# Patient Record
Sex: Female | Born: 1935 | Race: White | Hispanic: No | Marital: Married | Smoking: Never smoker
Health system: Southern US, Community
[De-identification: ages and names within clinical notes are randomized; demographics above are authoritative.]

## PROBLEM LIST (undated history)

## (undated) DIAGNOSIS — E7800 Pure hypercholesterolemia, unspecified: Secondary | ICD-10-CM

## (undated) DIAGNOSIS — I639 Cerebral infarction, unspecified: Secondary | ICD-10-CM

## (undated) DIAGNOSIS — I2699 Other pulmonary embolism without acute cor pulmonale: Secondary | ICD-10-CM

## (undated) DIAGNOSIS — T8859XA Other complications of anesthesia, initial encounter: Secondary | ICD-10-CM

## (undated) DIAGNOSIS — I219 Acute myocardial infarction, unspecified: Secondary | ICD-10-CM

## (undated) DIAGNOSIS — S82209A Unspecified fracture of shaft of unspecified tibia, initial encounter for closed fracture: Secondary | ICD-10-CM

## (undated) DIAGNOSIS — M25559 Pain in unspecified hip: Secondary | ICD-10-CM

## (undated) DIAGNOSIS — R112 Nausea with vomiting, unspecified: Secondary | ICD-10-CM

## (undated) DIAGNOSIS — K449 Diaphragmatic hernia without obstruction or gangrene: Secondary | ICD-10-CM

## (undated) DIAGNOSIS — I2510 Atherosclerotic heart disease of native coronary artery without angina pectoris: Secondary | ICD-10-CM

## (undated) DIAGNOSIS — T4145XA Adverse effect of unspecified anesthetic, initial encounter: Secondary | ICD-10-CM

## (undated) DIAGNOSIS — Z9889 Other specified postprocedural states: Secondary | ICD-10-CM

## (undated) HISTORY — PX: CORONARY STENT PLACEMENT: SHX1402

## (undated) HISTORY — PX: FRACTURE SURGERY: SHX138

## (undated) HISTORY — PX: LEG SURGERY: SHX1003

## (undated) HISTORY — PX: ABDOMINAL HYSTERECTOMY: SHX81

---

## 2012-10-23 ENCOUNTER — Encounter (HOSPITAL_COMMUNITY): Payer: Self-pay

## 2012-10-23 ENCOUNTER — Emergency Department (HOSPITAL_COMMUNITY)
Admission: EM | Admit: 2012-10-23 | Discharge: 2012-10-23 | Disposition: A | Discharge status: Home / Self Care | Payer: Medicare Other | Attending: Emergency Medicine | Admitting: Emergency Medicine

## 2012-10-23 ENCOUNTER — Emergency Department (HOSPITAL_COMMUNITY): Payer: Medicare Other

## 2012-10-23 DIAGNOSIS — E7800 Pure hypercholesterolemia, unspecified: Secondary | ICD-10-CM | POA: Insufficient documentation

## 2012-10-23 DIAGNOSIS — Z8719 Personal history of other diseases of the digestive system: Secondary | ICD-10-CM | POA: Insufficient documentation

## 2012-10-23 DIAGNOSIS — Z79899 Other long term (current) drug therapy: Secondary | ICD-10-CM | POA: Insufficient documentation

## 2012-10-23 DIAGNOSIS — Z86711 Personal history of pulmonary embolism: Secondary | ICD-10-CM | POA: Insufficient documentation

## 2012-10-23 DIAGNOSIS — Z8739 Personal history of other diseases of the musculoskeletal system and connective tissue: Secondary | ICD-10-CM | POA: Insufficient documentation

## 2012-10-23 DIAGNOSIS — R109 Unspecified abdominal pain: Secondary | ICD-10-CM

## 2012-10-23 DIAGNOSIS — R112 Nausea with vomiting, unspecified: Secondary | ICD-10-CM | POA: Insufficient documentation

## 2012-10-23 DIAGNOSIS — Z7982 Long term (current) use of aspirin: Secondary | ICD-10-CM | POA: Insufficient documentation

## 2012-10-23 DIAGNOSIS — Z87828 Personal history of other (healed) physical injury and trauma: Secondary | ICD-10-CM | POA: Insufficient documentation

## 2012-10-23 DIAGNOSIS — K5900 Constipation, unspecified: Secondary | ICD-10-CM | POA: Insufficient documentation

## 2012-10-23 DIAGNOSIS — R1013 Epigastric pain: Secondary | ICD-10-CM | POA: Insufficient documentation

## 2012-10-23 HISTORY — DX: Pure hypercholesterolemia, unspecified: E78.00

## 2012-10-23 HISTORY — DX: Other pulmonary embolism without acute cor pulmonale: I26.99

## 2012-10-23 HISTORY — DX: Pain in unspecified hip: M25.559

## 2012-10-23 HISTORY — DX: Unspecified fracture of shaft of unspecified tibia, initial encounter for closed fracture: S82.209A

## 2012-10-23 HISTORY — DX: Diaphragmatic hernia without obstruction or gangrene: K44.9

## 2012-10-23 LAB — URINALYSIS, ROUTINE W REFLEX MICROSCOPIC
Bilirubin Urine: NEGATIVE
Glucose, UA: NEGATIVE mg/dL
Ketones, ur: NEGATIVE mg/dL
Leukocytes, UA: NEGATIVE
Nitrite: NEGATIVE
Specific Gravity, Urine: 1.015 (ref 1.005–1.030)
pH: 7 (ref 5.0–8.0)

## 2012-10-23 LAB — CBC WITH DIFFERENTIAL/PLATELET
Eosinophils Absolute: 0 10*3/uL (ref 0.0–0.7)
Eosinophils Relative: 0 % (ref 0–5)
HCT: 42.2 % (ref 36.0–46.0)
Hemoglobin: 14.2 g/dL (ref 12.0–15.0)
Lymphocytes Relative: 12 % (ref 12–46)
Lymphs Abs: 1.7 10*3/uL (ref 0.7–4.0)
MCH: 29.6 pg (ref 26.0–34.0)
MCV: 88.1 fL (ref 78.0–100.0)
Monocytes Absolute: 1.3 10*3/uL — ABNORMAL HIGH (ref 0.1–1.0)
Monocytes Relative: 9 % (ref 3–12)
RBC: 4.79 MIL/uL (ref 3.87–5.11)

## 2012-10-23 LAB — BASIC METABOLIC PANEL
BUN: 17 mg/dL (ref 6–23)
CO2: 29 mEq/L (ref 19–32)
Calcium: 10.4 mg/dL (ref 8.4–10.5)
GFR calc non Af Amer: 80 mL/min — ABNORMAL LOW (ref >90)
Glucose, Bld: 120 mg/dL — ABNORMAL HIGH (ref 70–99)

## 2012-10-23 MED ORDER — GI COCKTAIL ~~LOC~~
30.0000 mL | Freq: Once | ORAL | Status: AC
  Administered 2012-10-23: 30 mL via ORAL
  Filled 2012-10-23: qty 30

## 2012-10-23 MED ORDER — TRAMADOL HCL 50 MG PO TABS
50.0000 mg | ORAL_TABLET | Freq: Once | ORAL | Status: AC
  Administered 2012-10-23: 50 mg via ORAL
  Filled 2012-10-23: qty 1

## 2012-10-23 MED ORDER — ONDANSETRON HCL 4 MG/2ML IJ SOLN
4.0000 mg | Freq: Once | INTRAMUSCULAR | Status: AC
  Administered 2012-10-23: 4 mg via INTRAVENOUS
  Filled 2012-10-23: qty 2

## 2012-10-23 MED ORDER — SODIUM CHLORIDE 0.9 % IV SOLN
1000.0000 mL | Freq: Once | INTRAVENOUS | Status: AC
  Administered 2012-10-23: 1000 mL via INTRAVENOUS

## 2012-10-23 MED ORDER — HYDROMORPHONE HCL PF 1 MG/ML IJ SOLN
0.5000 mg | Freq: Once | INTRAMUSCULAR | Status: DC
  Filled 2012-10-23: qty 1

## 2012-10-23 MED ORDER — SODIUM CHLORIDE 0.9 % IV SOLN
Freq: Once | INTRAVENOUS | Status: AC
  Administered 2012-10-23: 1000 mL via INTRAVENOUS

## 2012-10-23 MED ORDER — OMEPRAZOLE 20 MG PO CPDR: 20.0000 mg | DELAYED_RELEASE_CAPSULE | Freq: Every day | ORAL | Status: DC

## 2012-10-23 MED ORDER — SUCRALFATE 1 GM/10ML PO SUSP: 1.0000 g | ORAL | Status: DC | PRN

## 2012-10-23 NOTE — ED Notes (Signed)
Pt reports woke up in the middle of the night with nonradiating  substernal chest pain, n/v.  Reports has vomited x 2 today.  Describes pain as a "squeezing pain."  Says is SOB at times.

## 2012-10-23 NOTE — ED Provider Notes (Signed)
History   This chart was scribed for Alexandria Munch, MD by [name]. The patient was seen in room APA02/[facility]02 and the patient's care was started at 3:19PM.    CSN: [id number]  Arrival date & time 10/23/12  1426   First MD Initiated Contact with Patient 10/23/12 1519      Chief Complaint  Patient presents with  . Chest Pain    The history is provided by the patient. No language interpreter was used.    Alexandria Castillo is a 78 y.o. female , with a hx of hiatal hernia, who presents to the Emergency Department complaining of intermittent, progressively worsening, epigastric pain, onset yesterday (10/22/12 at 8:00PM).  Associated symptoms include nausea and vomiting ( X 1 today, dark in color). The pt reports she ate peanut butter crackers yesterday evening, where shortly thereafter, she began to experience a painful sensation located at her epigastrum. In addition, the pt informs the last time she had a heart exam performed was last year. The pt has taken tramadol which provides moderate relief of the epigastric pain.  The pt denies any recent illness, difficulty breathing, nausea, LOC, and weakness associated with the pain.   The pt does not smoke or drink alcohol.   PCP is Dr. Maryellen Pile.    Past Medical History  Diagnosis Date  . Hiatal hernia   . Hypercholesterolemia   . Fractured tibia   . Hip pain   . PE (pulmonary embolism)     Past Surgical History  Procedure Date  . Abdominal hysterectomy   . Leg surgery     No family history on file.  History  Substance Use Topics  . Smoking status: Never Smoker   . Smokeless tobacco: Not on file  . Alcohol Use: No    OB History    Grav Para Term Preterm Abortions TAB SAB Ect Mult Living                  Review of Systems  Constitutional:       Per HPI, otherwise negative  HENT:       Per HPI, otherwise negative  Eyes: Negative.   Respiratory:       Per HPI, otherwise negative  Cardiovascular:       Per HPI, otherwise  negative  Gastrointestinal: Positive for nausea, vomiting and constipation. Negative for blood in stool.  Genitourinary: Negative.   Musculoskeletal:       Per HPI, otherwise negative  Skin: Negative.   Neurological: Negative for syncope.    Allergies  Review of patient's allergies indicates no known allergies.  Home Medications   Current Outpatient Rx  Name  Route  Sig  Dispense  Refill  . ASPIRIN EC 325 MG PO TBEC   Oral   Take 325 mg by mouth daily.         Marland Kitchen CALCIUM 600 + D PO   Oral   Take 1 tablet by mouth daily.         . NYSTATIN 100000 UNIT/GM EX CREA   Topical   Apply 1 application topically 2 (two) times daily.         Marland Kitchen FISH OIL 1000 MG PO CAPS   Oral   Take 2 capsules by mouth daily.         . TRAMADOL HCL 50 MG PO TABS   Oral   Take 50 mg by mouth every 6 (six) hours as needed. Pain.         Marland Kitchen  VITAMIN B-12 500 MCG PO TABS   Oral   Take 500 mcg by mouth daily.         Marland Kitchen VITAMIN D (ERGOCALCIFEROL) 50000 UNITS PO CAPS   Oral   Take 50,000 Units by mouth every 7 (seven) days.           BP 168/75  Pulse 73  Temp 98.1 F (36.7 C) (Oral)  Resp 21  SpO2 96%  Physical Exam  Nursing note and vitals reviewed. Constitutional: She is oriented to person, place, and time. She appears well-developed and well-nourished. No distress.  HENT:  Head: Normocephalic and atraumatic.  Eyes: Conjunctivae normal and EOM are normal.  Cardiovascular: Normal rate and regular rhythm.   Pulmonary/Chest: Effort normal and breath sounds normal. No stridor. No respiratory distress. She has no wheezes. She has no rales. She exhibits no tenderness.  Abdominal: She exhibits no distension. There is tenderness. There is no rebound and no guarding.       Epigastric tenderness detected upon palpitation.   Musculoskeletal: She exhibits no edema.  Neurological: She is alert and oriented to person, place, and time. No cranial nerve deficit.  Skin: Skin is warm and dry.   Psychiatric: She has a normal mood and affect.    ED Course  Procedures (including critical care time)  DIAGNOSTIC STUDIES: Oxygen Saturation is 96% on room air, adequate by my interpretation.    COORDINATION OF CARE:    3:37 PM- Treatment plan concerning pain management, nausea discussed with patient. Pt agrees with treatment.   5:23 PM- Recheck. Pt reports feeling better. Treatment plan concerning follow up with GI discussed with patient and pt's family members. Pt and pt's family agrees with treatment.      Results for orders placed during the hospital encounter of 10/23/12  CBC WITH DIFFERENTIAL      Component Value Range   WBC 14.4 (*) 4.0 - 10.5 K/uL   RBC 4.79  3.87 - 5.11 MIL/uL   Hemoglobin 14.2  12.0 - 15.0 g/dL   HCT 16.1  09.6 - 04.5 %   MCV 88.1  78.0 - 100.0 fL   MCH 29.6  26.0 - 34.0 pg   MCHC 33.6  30.0 - 36.0 g/dL   RDW 40.9 (*) 81.1 - 91.4 %   Platelets 195  150 - 400 K/uL   Neutrophils Relative 79 (*) 43 - 77 %   Neutro Abs 11.4 (*) 1.7 - 7.7 K/uL   Lymphocytes Relative 12  12 - 46 %   Lymphs Abs 1.7  0.7 - 4.0 K/uL   Monocytes Relative 9  3 - 12 %   Monocytes Absolute 1.3 (*) 0.1 - 1.0 K/uL   Eosinophils Relative 0  0 - 5 %   Eosinophils Absolute 0.0  0.0 - 0.7 K/uL   Basophils Relative 0  0 - 1 %   Basophils Absolute 0.0  0.0 - 0.1 K/uL  BASIC METABOLIC PANEL      Component Value Range   Sodium 138  135 - 145 mEq/L   Potassium 3.9  3.5 - 5.1 mEq/L   Chloride 101  96 - 112 mEq/L   CO2 29  19 - 32 mEq/L   Glucose, Bld 120 (*) 70 - 99 mg/dL   BUN 17  6 - 23 mg/dL   Creatinine, Ser 7.82  0.50 - 1.10 mg/dL   Calcium 95.6  8.4 - 21.3 mg/dL   GFR calc non Af Amer 80 (*) >90 mL/min  GFR calc Af Amer >90  >90 mL/min  TROPONIN I      Component Value Range   Troponin I <0.30  <0.30 ng/mL  LIPASE, BLOOD      Component Value Range   Lipase 14  11 - 59 U/L  URINALYSIS, ROUTINE W REFLEX MICROSCOPIC      Component Value Range   Color, Urine YELLOW   YELLOW   APPearance CLEAR  CLEAR   Specific Gravity, Urine 1.015  1.005 - 1.030   pH 7.0  5.0 - 8.0   Glucose, UA NEGATIVE  NEGATIVE mg/dL   Hgb urine dipstick NEGATIVE  NEGATIVE   Bilirubin Urine NEGATIVE  NEGATIVE   Ketones, ur NEGATIVE  NEGATIVE mg/dL   Protein, ur NEGATIVE  NEGATIVE mg/dL   Urobilinogen, UA 0.2  0.0 - 1.0 mg/dL   Nitrite NEGATIVE  NEGATIVE   Leukocytes, UA NEGATIVE  NEGATIVE   Dg Chest Portable 1 View  10/23/2012  *RADIOLOGY REPORT*  Clinical Data: Hiatal hernia.  Hysterectomy.  Chest pain.  PORTABLE CHEST - 1 VIEW  Comparison: None.  Findings: Upper normal heart size noted without edema.  Lungs appear clear.  Mild dextroconvex thoracic scoliosis is observed. No pleural effusion identified.  Right paratracheal density is likely vascular and less likely to be due to nodal enlargement.  IMPRESSION:  1.  Heart size is in the upper normal range.  No edema. 2.  Right upper paratracheal density, favor vascular over adenopathy. 3.  Dextroconvex thoracic scoliosis.   Original Report Authenticated By: Gaylyn Rong, M.D.      Cardiac: 71sr, normal  O2- 99%ra, normal   Date: 10/23/2012  Rate: 76  Rhythm: normal sinus rhythm and sinus arrhythmia  QRS Axis: normal  Intervals: normal  ST/T Wave abnormalities: nonspecific T wave changes  Conduction Disutrbances:right bundle branch block  Narrative Interpretation:   Old EKG Reviewed: none available  ABNORMAL   No diagnosis found.  UPDATE: - the patient feels better following GI cocktail.   Update: - patient and family informed of results.  She continues to feel better.    MDM  I personally performed the services described in this documentation, which was scribed in my presence. The recorded information has been reviewed and considered.  This pleasant elderly female presents with one day of epigastric pain.  On exam she is in no distress, with unremarkable vital signs.  The patient has tenderness to palpation  about the epigastrium.  Given the patient's relief with a GI cocktail, the absence of positive cardiac markers, which would likely reflects any ongoing cardiac ischemia, given the passage of time since the onset of symptoms, as well as her generally benign presentation, this presentation is unlikely do to cardiac ischemia, or infectious etiology.  There is some suspicion of gastro-, esophageal irritation, plus minus hiatal hernia effects. I discussed all findings, thoughts with the patient and her family, and she was discharged in stable condition with a new PPI, as well as topical medication, with GI and PMD followup  Alexandria Munch, MD 10/23/12 1746

## 2012-11-06 ENCOUNTER — Ambulatory Visit (INDEPENDENT_AMBULATORY_CARE_PROVIDER_SITE_OTHER): Payer: Medicare Other | Admitting: Internal Medicine

## 2012-11-06 ENCOUNTER — Encounter (INDEPENDENT_AMBULATORY_CARE_PROVIDER_SITE_OTHER): Payer: Self-pay | Admitting: *Deleted

## 2012-11-06 ENCOUNTER — Other Ambulatory Visit (INDEPENDENT_AMBULATORY_CARE_PROVIDER_SITE_OTHER): Payer: Self-pay | Admitting: *Deleted

## 2012-11-06 ENCOUNTER — Encounter (INDEPENDENT_AMBULATORY_CARE_PROVIDER_SITE_OTHER): Payer: Self-pay | Admitting: Internal Medicine

## 2012-11-06 VITALS — BP 106/80 | Temp 98.3°F | Ht 63.0 in | Wt 198.0 lb

## 2012-11-06 DIAGNOSIS — R1310 Dysphagia, unspecified: Secondary | ICD-10-CM | POA: Insufficient documentation

## 2012-11-06 DIAGNOSIS — K920 Hematemesis: Secondary | ICD-10-CM | POA: Insufficient documentation

## 2012-11-06 NOTE — Patient Instructions (Addendum)
EGD/ED 

## 2012-11-06 NOTE — Progress Notes (Addendum)
Subjective:     Patient ID: Alexandria Castillo, female   DOB: Apr 04, 1929, 78 y.o.   MRN: [id number]  HPI Referred to our office by the ED on 10/23/2012.  She has epigastric pain. She also vomited coffee ground vomitus. Appetite is good. No weight. No epigastric pain now.  Her husband says she vomited black. She thinks she vomited about 1/2 quart.. There was no further vomiting. She received a GI cocktail and this relieved her pain. Cardiac was ruled out. Her BMs are tan colored. She has been taking ASA 325mg  daily. No other NSAIDS. Started on Carafate and Omeprazole Occasionally dysphagia to pills. Meats are slow to go down.  CBC    Component Value Date/Time   WBC 14.4* 10/23/2012 1456   RBC 4.79 10/23/2012 1456   HGB 14.2 10/23/2012 1456   HCT 42.2 10/23/2012 1456   PLT 195 10/23/2012 1456   MCV 88.1 10/23/2012 1456   MCH 29.6 10/23/2012 1456   MCHC 33.6 10/23/2012 1456   RDW 16.0* 10/23/2012 1456   LYMPHSABS 1.7 10/23/2012 1456   MONOABS 1.3* 10/23/2012 1456   EOSABS 0.0 10/23/2012 1456   BASOSABS 0.0 10/23/2012 1456      Review of Systems see hpi     Past Surgical History  Procedure Date  . Abdominal hysterectomy   . Leg surgery    Allergies  Allergen Reactions  . Baycol (Cerivastatin)   . Ciprofloxacin   . Decadron (Dexamethasone)   . Imipramine   . Oxycodone   . Premarin (Conjugated Estrogens)   . Synthroid (Levothyroxine)         Objective:   Physical Exam Filed Vitals:   11/06/12 1058  BP: 106/80  Temp: 98.3 F (36.8 C)  Height: 5\' 3"  (1.6 m)  Weight: 198 lb (89.812 kg)  Patient examined from wheelchair.  Weight is stated. Unable to stand Alert and oriented. Skin warm and dry. Oral mucosa is moist.   . Sclera anicteric, conjunctivae is pink. Thyroid not enlarged. No cervical lymphadenopathy. Lungs clear. Heart regular rate and rhythm.  Abdomen is soft. Bowel sounds are positive. No hepatomegaly. No abdominal masses felt. No tenderness.  Stool brown and guaiac  negative.     Assessment:   hematemesis. PUD/Gastric carcinoma, AVM needs to be ruled out. Dysphagia: stricture needs to be ruled out.     Plan:    EGD/ED with Dr. Karilyn Cota.

## 2012-11-13 ENCOUNTER — Encounter (HOSPITAL_COMMUNITY): Payer: Self-pay | Admitting: Pharmacy Technician

## 2012-11-20 ENCOUNTER — Encounter (INDEPENDENT_AMBULATORY_CARE_PROVIDER_SITE_OTHER): Payer: Self-pay

## 2012-12-03 MED ORDER — SODIUM CHLORIDE 0.45 % IV SOLN
INTRAVENOUS | Status: DC
  Administered 2012-12-04: 12:00:00 via INTRAVENOUS

## 2012-12-04 ENCOUNTER — Encounter (HOSPITAL_COMMUNITY): Payer: Self-pay

## 2012-12-04 ENCOUNTER — Ambulatory Visit (HOSPITAL_COMMUNITY)
Admission: RE | Admit: 2012-12-04 | Discharge: 2012-12-04 | Disposition: A | Discharge status: Home / Self Care | Payer: Medicare Other | Source: Ambulatory Visit | Attending: Internal Medicine | Admitting: Internal Medicine

## 2012-12-04 ENCOUNTER — Encounter (HOSPITAL_COMMUNITY)
Admission: RE | Disposition: A | Discharge status: Home / Self Care | Payer: Self-pay | Source: Ambulatory Visit | Attending: Internal Medicine

## 2012-12-04 DIAGNOSIS — R1310 Dysphagia, unspecified: Secondary | ICD-10-CM

## 2012-12-04 DIAGNOSIS — Z8719 Personal history of other diseases of the digestive system: Secondary | ICD-10-CM

## 2012-12-04 DIAGNOSIS — K222 Esophageal obstruction: Secondary | ICD-10-CM

## 2012-12-04 DIAGNOSIS — K449 Diaphragmatic hernia without obstruction or gangrene: Secondary | ICD-10-CM | POA: Insufficient documentation

## 2012-12-04 DIAGNOSIS — K2990 Gastroduodenitis, unspecified, without bleeding: Secondary | ICD-10-CM

## 2012-12-04 DIAGNOSIS — K208 Other esophagitis: Secondary | ICD-10-CM

## 2012-12-04 DIAGNOSIS — K209 Esophagitis, unspecified without bleeding: Secondary | ICD-10-CM | POA: Insufficient documentation

## 2012-12-04 DIAGNOSIS — K2970 Gastritis, unspecified, without bleeding: Secondary | ICD-10-CM

## 2012-12-04 DIAGNOSIS — K2960 Other gastritis without bleeding: Secondary | ICD-10-CM | POA: Insufficient documentation

## 2012-12-04 HISTORY — PX: ESOPHAGOGASTRODUODENOSCOPY (EGD) WITH ESOPHAGEAL DILATION: SHX5812

## 2012-12-04 HISTORY — DX: Adverse effect of unspecified anesthetic, initial encounter: T41.45XA

## 2012-12-04 HISTORY — DX: Other complications of anesthesia, initial encounter: T88.59XA

## 2012-12-04 HISTORY — DX: Other specified postprocedural states: R11.2

## 2012-12-04 HISTORY — DX: Nausea with vomiting, unspecified: Z98.890

## 2012-12-04 SURGERY — ESOPHAGOGASTRODUODENOSCOPY (EGD) WITH ESOPHAGEAL DILATION
Anesthesia: Moderate Sedation

## 2012-12-04 MED ORDER — BUTAMBEN-TETRACAINE-BENZOCAINE 2-2-14 % EX AERO
INHALATION_SPRAY | CUTANEOUS | Status: DC | PRN
  Administered 2012-12-04: 2 via TOPICAL

## 2012-12-04 MED ORDER — MEPERIDINE HCL 25 MG/ML IJ SOLN
INTRAMUSCULAR | Status: DC | PRN
  Administered 2012-12-04 (×2): 25 mg via INTRAVENOUS

## 2012-12-04 MED ORDER — MIDAZOLAM HCL 5 MG/5ML IJ SOLN
INTRAMUSCULAR | Status: DC | PRN
  Administered 2012-12-04 (×3): 1 mg via INTRAVENOUS
  Administered 2012-12-04: 2 mg via INTRAVENOUS

## 2012-12-04 MED ORDER — MEPERIDINE HCL 50 MG/ML IJ SOLN
INTRAMUSCULAR | Status: AC
  Filled 2012-12-04: qty 1

## 2012-12-04 MED ORDER — MIDAZOLAM HCL 5 MG/5ML IJ SOLN
INTRAMUSCULAR | Status: AC
  Filled 2012-12-04: qty 10

## 2012-12-04 NOTE — Op Note (Signed)
EGD PROCEDURE REPORT  PATIENT:  Alexandria Castillo  MR#:  [id number] Birthdate:  1929-06-26, 78 y.o., female Endoscopist:  Dr. Malissa Hippo, MD Referred By:  Dr. Kathlee Nations, MD Procedure Date: 12/04/2012  Procedure:   EGD with ED.  Indications:  Patient is 78 year old Caucasian female who is chronically on aspirin 325 mg daily who presented with coffee on emesis but 6 weeks ago. She's been treated with PPI and Carafate and doing fine. She also complains of intermittent solid food dysphagia. She is undergoing diagnostic/therapeutic EGD.            Informed Consent:  The risks, benefits, alternatives & imponderables which include, but are not limited to, bleeding, infection, perforation, drug reaction and potential missed lesion have been reviewed.  The potential for biopsy, lesion removal, esophageal dilation, etc. have also been discussed.  Questions have been answered.  All parties agreeable.  Please see history & physical in medical record for more information.  Medications:  Demerol 50 mg IV Versed 5 mg IV Cetacaine spray topically for oropharyngeal anesthesia  Description of procedure:  The endoscope was introduced through the mouth and advanced to the second portion of the duodenum without difficulty or limitations. The mucosal surfaces were surveyed very carefully during advancement of the scope and upon withdrawal.  Findings:  Esophagus:  Mucosa of the esophagus was normal. Focal edema and erythema noted at GE junction. GEJ:  34 cm Hiatus:  36 cm Stomach:  Stomach was empty and distended very well with insufflation. Folds in the proximal stomach were normal. Examination mucosa at body was normal. There were two antral scars along with patchy edema and erythema. Pyloric channel was patent. Angularis fundus and cardia were examined by retroflexing the scope and were normal. Duodenum:  Normal bulbar and post bulbar mucosa. Large diverticulum noted long the medial wall of second part of  duodenum.  Therapeutic/Diagnostic Maneuvers Performed:   Esophagus dilated by passing 54 French Maloney dilator resulting in mucosal disruption just below UES along with small submucosal action of blood.  Complications:  None  Impression: Mild changes of reflux esophagitis limited to GE junction  Small sliding hiatal hernia. Antral gastritis with two scars implying healed ulcers. Esophageal dilation performed by passing 54 French Maloney dilator resulting in small mucosal disruption below UES indicative of esophageal web.  Recommendations:  Standard instructions given. Patient will continue omeprazole at 20 mg by mouth every morning. Resume aspirin in 2 days. Will check H. pylori serology today.  REHMAN,NAJEEB U  12/04/2012  1:24 PM  CC: Dr. Kathlee Nations, MD & Dr. Bonnetta Barry ref. provider found

## 2012-12-04 NOTE — H&P (Addendum)
Alexandria Castillo is an 78 y.o. female.   Chief Complaint: Patient is in for EGD. HPI: Patient is 78 year old Caucasian female who experienced coffee-ground emesis about 6 weeks ago when she was seen in emergency room and felt to be stable. She was begun on PPI and sucralfate. She has not had any more episodes. She denies chronic heartburn epigastric pain or melena. She does take aspirin 325 mg by mouth daily. He has good appetite and has not lost any weight. He also complains of intermittent solid food dysphagia.  Past Medical History  Diagnosis Date  . Hiatal hernia   . Hypercholesterolemia   . Fractured tibia   . Hip pain   . PE (pulmonary embolism)   . Complication of anesthesia   . PONV (postoperative nausea and vomiting)     Past Surgical History  Procedure Date  . Abdominal hysterectomy   . Leg surgery     History reviewed. No pertinent family history. Social History:  reports that she has never smoked. She does not have any smokeless tobacco history on file. She reports that she does not drink alcohol or use illicit drugs.  Allergies:  Allergies  Allergen Reactions  . Baycol (Cerivastatin)   . Ciprofloxacin   . Decadron (Dexamethasone)   . Imipramine   . Oxycodone   . Premarin (Conjugated Estrogens)   . Synthroid (Levothyroxine)     Medications Prior to Admission  Medication Sig Dispense Refill  . aspirin EC 325 MG tablet Take 325 mg by mouth daily.      . Calcium Carbonate-Vitamin D (CALCIUM 600 + D PO) Take 1 tablet by mouth daily.      Marland Kitchen nystatin cream (MYCOSTATIN) Apply 1 application topically 2 (two) times daily.      . Omega-3 Fatty Acids (FISH OIL) 1000 MG CAPS Take 2 capsules by mouth daily.      Marland Kitchen omeprazole (PRILOSEC) 20 MG capsule Take 1 capsule (20 mg total) by mouth daily.  30 capsule  0  . sucralfate (CARAFATE) 1 GM/10ML suspension Take 10 mLs (1 g total) by mouth every 4 (four) hours as needed (abdominal pain).  420 mL  0  . traMADol (ULTRAM) 50 MG  tablet Take 50 mg by mouth every 6 (six) hours as needed. Pain.      . vitamin B-12 (CYANOCOBALAMIN) 500 MCG tablet Take 500 mcg by mouth daily.      . Vitamin D, Ergocalciferol, (DRISDOL) 50000 UNITS CAPS Take 50,000 Units by mouth every 7 (seven) days.        No results found for this or any previous visit (from the past 48 hour(s)). No results found.  ROS  Blood pressure 169/91, pulse 85, temperature 97.6 F (36.4 C), temperature source Oral, resp. rate 12, height 5\' 3"  (1.6 m), weight 198 lb (89.812 kg). Physical Exam  Constitutional: She appears well-developed and well-nourished.  HENT:  Mouth/Throat: Oropharynx is clear and moist.  Eyes: Conjunctivae normal are normal. No scleral icterus.  Neck: No thyromegaly present.  Cardiovascular: Regular rhythm and normal heart sounds.   No murmur heard. Respiratory: Effort normal.  GI: Soft. She exhibits no distension and no mass.  Musculoskeletal: She exhibits no edema.  Lymphadenopathy:    She has no cervical adenopathy.  Neurological: She is alert.  Skin: Skin is warm and dry.     Assessment/Plan History of coffee-ground emesis in patient who is in aspirin. She also has intermittent solid food dysphagia EGD and possible ED.  [name] U  12/04/2012, 12:58 PM

## 2012-12-05 LAB — H. PYLORI ANTIBODY, IGG: H Pylori IgG: 0.67 {ISR}

## 2012-12-06 ENCOUNTER — Encounter (HOSPITAL_COMMUNITY): Payer: Self-pay | Admitting: Internal Medicine

## 2014-07-11 ENCOUNTER — Emergency Department (HOSPITAL_COMMUNITY): Payer: Medicare PPO

## 2014-07-11 ENCOUNTER — Inpatient Hospital Stay (HOSPITAL_COMMUNITY)
Admission: EM | Admit: 2014-07-11 | Discharge: 2014-07-14 | DRG: 981 | Disposition: A | Discharge status: Home / Self Care | Payer: Medicare PPO | Attending: Cardiology | Admitting: Cardiology

## 2014-07-11 ENCOUNTER — Observation Stay (HOSPITAL_COMMUNITY): Payer: Medicare PPO

## 2014-07-11 ENCOUNTER — Encounter (HOSPITAL_COMMUNITY): Payer: Self-pay | Admitting: Emergency Medicine

## 2014-07-11 DIAGNOSIS — Z993 Dependence on wheelchair: Secondary | ICD-10-CM | POA: Diagnosis not present

## 2014-07-11 DIAGNOSIS — G459 Transient cerebral ischemic attack, unspecified: Secondary | ICD-10-CM | POA: Diagnosis present

## 2014-07-11 DIAGNOSIS — Z9861 Coronary angioplasty status: Secondary | ICD-10-CM

## 2014-07-11 DIAGNOSIS — Z888 Allergy status to other drugs, medicaments and biological substances status: Secondary | ICD-10-CM | POA: Diagnosis not present

## 2014-07-11 DIAGNOSIS — Z7982 Long term (current) use of aspirin: Secondary | ICD-10-CM

## 2014-07-11 DIAGNOSIS — I472 Ventricular tachycardia, unspecified: Secondary | ICD-10-CM | POA: Diagnosis not present

## 2014-07-11 DIAGNOSIS — Z86711 Personal history of pulmonary embolism: Secondary | ICD-10-CM | POA: Diagnosis not present

## 2014-07-11 DIAGNOSIS — Z885 Allergy status to narcotic agent status: Secondary | ICD-10-CM

## 2014-07-11 DIAGNOSIS — I639 Cerebral infarction, unspecified: Secondary | ICD-10-CM

## 2014-07-11 DIAGNOSIS — E785 Hyperlipidemia, unspecified: Secondary | ICD-10-CM | POA: Diagnosis present

## 2014-07-11 DIAGNOSIS — I2119 ST elevation (STEMI) myocardial infarction involving other coronary artery of inferior wall: Secondary | ICD-10-CM | POA: Diagnosis not present

## 2014-07-11 DIAGNOSIS — I4729 Other ventricular tachycardia: Secondary | ICD-10-CM | POA: Diagnosis not present

## 2014-07-11 DIAGNOSIS — R202 Paresthesia of skin: Secondary | ICD-10-CM

## 2014-07-11 DIAGNOSIS — I6340 Cerebral infarction due to embolism of unspecified cerebral artery: Principal | ICD-10-CM | POA: Diagnosis present

## 2014-07-11 DIAGNOSIS — Z955 Presence of coronary angioplasty implant and graft: Secondary | ICD-10-CM

## 2014-07-11 DIAGNOSIS — R200 Anesthesia of skin: Secondary | ICD-10-CM | POA: Diagnosis present

## 2014-07-11 DIAGNOSIS — I2510 Atherosclerotic heart disease of native coronary artery without angina pectoris: Secondary | ICD-10-CM | POA: Diagnosis present

## 2014-07-11 DIAGNOSIS — M549 Dorsalgia, unspecified: Secondary | ICD-10-CM | POA: Diagnosis not present

## 2014-07-11 DIAGNOSIS — Z881 Allergy status to other antibiotic agents status: Secondary | ICD-10-CM

## 2014-07-11 DIAGNOSIS — R209 Unspecified disturbances of skin sensation: Secondary | ICD-10-CM

## 2014-07-11 DIAGNOSIS — I2699 Other pulmonary embolism without acute cor pulmonale: Secondary | ICD-10-CM

## 2014-07-11 DIAGNOSIS — R29898 Other symptoms and signs involving the musculoskeletal system: Secondary | ICD-10-CM | POA: Diagnosis present

## 2014-07-11 LAB — CBC
HCT: 39.5 % (ref 36.0–46.0)
HEMATOCRIT: 40.1 % (ref 36.0–46.0)
HEMOGLOBIN: 13.6 g/dL (ref 12.0–15.0)
HEMOGLOBIN: 13.3 g/dL (ref 12.0–15.0)
MCH: 30 pg (ref 26.0–34.0)
MCH: 29.6 pg (ref 26.0–34.0)
MCHC: 33.9 g/dL (ref 30.0–36.0)
MCHC: 33.7 g/dL (ref 30.0–36.0)
MCV: 88.3 fL (ref 78.0–100.0)
MCV: 88 fL (ref 78.0–100.0)
Platelets: 191 10*3/uL (ref 150–400)
Platelets: 188 10*3/uL (ref 150–400)
RBC: 4.54 MIL/uL (ref 3.87–5.11)
RBC: 4.49 MIL/uL (ref 3.87–5.11)
RDW: 16.5 % — AB (ref 11.5–15.5)
RDW: 16.3 % — ABNORMAL HIGH (ref 11.5–15.5)
WBC: 9.5 10*3/uL (ref 4.0–10.5)
WBC: 7.7 10*3/uL (ref 4.0–10.5)

## 2014-07-11 LAB — DIFFERENTIAL
BASOS ABS: 0 10*3/uL (ref 0.0–0.1)
BASOS PCT: 0 % (ref 0–1)
Eosinophils Absolute: 0.2 10*3/uL (ref 0.0–0.7)
Eosinophils Relative: 2 % (ref 0–5)
LYMPHS PCT: 19 % (ref 12–46)
Lymphs Abs: 1.8 10*3/uL (ref 0.7–4.0)
MONO ABS: 0.6 10*3/uL (ref 0.1–1.0)
MONOS PCT: 6 % (ref 3–12)
NEUTROS ABS: 6.9 10*3/uL (ref 1.7–7.7)
NEUTROS PCT: 73 % (ref 43–77)

## 2014-07-11 LAB — HEMOGLOBIN A1C
Hgb A1c MFr Bld: 5.6 % (ref <5.7)
MEAN PLASMA GLUCOSE: 114 mg/dL (ref <117)

## 2014-07-11 LAB — LIPID PANEL
CHOLESTEROL: 217 mg/dL — AB (ref 0–200)
HDL: 50 mg/dL (ref >39)
LDL Cholesterol: 151 mg/dL — ABNORMAL HIGH (ref 0–99)
Total CHOL/HDL Ratio: 4.3 RATIO
Triglycerides: 79 mg/dL (ref <150)
VLDL: 16 mg/dL (ref 0–40)

## 2014-07-11 LAB — COMPREHENSIVE METABOLIC PANEL
ALBUMIN: 3.7 g/dL (ref 3.5–5.2)
ALK PHOS: 74 U/L (ref 39–117)
ALT: 7 U/L (ref 0–35)
AST: 12 U/L (ref 0–37)
Anion gap: 11 (ref 5–15)
BILIRUBIN TOTAL: 0.4 mg/dL (ref 0.3–1.2)
BUN: 18 mg/dL (ref 6–23)
CHLORIDE: 102 meq/L (ref 96–112)
CO2: 27 meq/L (ref 19–32)
Calcium: 9.7 mg/dL (ref 8.4–10.5)
Creatinine, Ser: 0.82 mg/dL (ref 0.50–1.10)
GFR calc Af Amer: 75 mL/min — ABNORMAL LOW (ref >90)
GFR, EST NON AFRICAN AMERICAN: 65 mL/min — AB (ref >90)
Glucose, Bld: 108 mg/dL — ABNORMAL HIGH (ref 70–99)
POTASSIUM: 3.7 meq/L (ref 3.7–5.3)
Sodium: 140 mEq/L (ref 137–147)
Total Protein: 6.5 g/dL (ref 6.0–8.3)

## 2014-07-11 LAB — CREATININE, SERUM
CREATININE: 0.67 mg/dL (ref 0.50–1.10)
GFR calc Af Amer: >90 mL/min (ref >90)
GFR calc non Af Amer: 80 mL/min — ABNORMAL LOW (ref >90)

## 2014-07-11 LAB — PROTIME-INR
INR: 0.92 (ref 0.00–1.49)
Prothrombin Time: 12.4 seconds (ref 11.6–15.2)

## 2014-07-11 LAB — TROPONIN I
Troponin I: <0.3 ng/mL (ref <0.30)
Troponin I: <0.3 ng/mL (ref <0.30)

## 2014-07-11 LAB — APTT: APTT: 29 s (ref 24–37)

## 2014-07-11 MED ORDER — TRAMADOL HCL 50 MG PO TABS
50.0000 mg | ORAL_TABLET | Freq: Four times a day (QID) | ORAL | Status: DC | PRN
  Administered 2014-07-11 – 2014-07-14 (×10): 50 mg via ORAL
  Filled 2014-07-11 (×10): qty 1

## 2014-07-11 MED ORDER — SODIUM CHLORIDE 0.9 % IV SOLN: 250.0000 mL | INTRAVENOUS | Status: DC | PRN

## 2014-07-11 MED ORDER — STROKE: EARLY STAGES OF RECOVERY BOOK
Freq: Once | Status: AC
  Administered 2014-07-11: 09:00:00
  Filled 2014-07-11: qty 1

## 2014-07-11 MED ORDER — SODIUM CHLORIDE 0.9 % IJ SOLN: 3.0000 mL | INTRAMUSCULAR | Status: DC | PRN

## 2014-07-11 MED ORDER — ENOXAPARIN SODIUM 40 MG/0.4ML ~~LOC~~ SOLN
40.0000 mg | SUBCUTANEOUS | Status: DC
  Administered 2014-07-11: 40 mg via SUBCUTANEOUS
  Filled 2014-07-11 (×2): qty 0.4

## 2014-07-11 MED ORDER — SUCRALFATE 1 GM/10ML PO SUSP
1.0000 g | Freq: Four times a day (QID) | ORAL | Status: DC | PRN
  Filled 2014-07-11: qty 10

## 2014-07-11 MED ORDER — SODIUM CHLORIDE 0.9 % IJ SOLN
3.0000 mL | Freq: Two times a day (BID) | INTRAMUSCULAR | Status: DC
  Administered 2014-07-11 – 2014-07-13 (×4): 3 mL via INTRAVENOUS

## 2014-07-11 MED ORDER — ACETAMINOPHEN 325 MG PO TABS
650.0000 mg | ORAL_TABLET | Freq: Four times a day (QID) | ORAL | Status: DC | PRN
  Administered 2014-07-11: 650 mg via ORAL
  Filled 2014-07-11: qty 2

## 2014-07-11 MED ORDER — ASPIRIN 325 MG PO TABS
325.0000 mg | ORAL_TABLET | Freq: Every day | ORAL | Status: DC
  Administered 2014-07-11: 325 mg via ORAL
  Filled 2014-07-11 (×2): qty 1

## 2014-07-11 MED ORDER — ASPIRIN-DIPYRIDAMOLE ER 25-200 MG PO CP12
1.0000 | ORAL_CAPSULE | Freq: Two times a day (BID) | ORAL | Status: DC
  Filled 2014-07-11 (×2): qty 1

## 2014-07-11 NOTE — ED Notes (Addendum)
Pt states her left arm is tingling or numb, has some control at times.can break gravity, but no weakness or difference in grip strength. No facial droop. Pt able to push against nurse hand with left arm without difficulty. Daughter states pt started new medication, mobic, 3 weeks ago for inflammation, and is concerned that this may be the cause of the problem.

## 2014-07-11 NOTE — H&P (Signed)
PCP:   Kathlee Nations, MD   Chief Complaint:  Left arm feels funny  HPI: 80 yo female h/o PE, hld comes in with left arm weakness, numbness and tingling that she noticed at 1030pm tonight.  This has improved since her arrival to ED, weakness is better but tingling still present.  No recent illnesses.  No n/v/d.  No facial drooping, n/t.  No slurred speech.  No symptoms in her legs.  No recent trauma.  She takes full dose aspirin daily.  Review of Systems:  Positive and negative as per HPI otherwise all other systems are negative  Past Medical History: Past Medical History  Diagnosis Date  . Hiatal hernia   . Hypercholesterolemia   . Fractured tibia   . Hip pain   . PE (pulmonary embolism)   . Complication of anesthesia   . PONV (postoperative nausea and vomiting)    Past Surgical History  Procedure Laterality Date  . Abdominal hysterectomy    . Leg surgery    . Esophagogastroduodenoscopy (egd) with esophageal dilation  12/04/2012    Procedure: ESOPHAGOGASTRODUODENOSCOPY (EGD) WITH ESOPHAGEAL DILATION;  Surgeon: Malissa Hippo, MD;  Location: [facility];  Service: Endoscopy;  Laterality: N/A;  255-rescheduled to 13:00 Ann to notify pt    Medications: Prior to Admission medications   Medication Sig Start Date End Date Taking? Authorizing Provider  meloxicam (MOBIC) 7.5 MG tablet Take 7.5 mg by mouth daily.   Yes Historical Provider, MD  Calcium Carbonate-Vitamin D (CALCIUM 600 + D PO) Take 1 tablet by mouth daily.    Historical Provider, MD  nystatin cream (MYCOSTATIN) Apply 1 application topically 2 (two) times daily.    Historical Provider, MD  Omega-3 Fatty Acids (FISH OIL) 1000 MG CAPS Take 2 capsules by mouth daily.    Historical Provider, MD  omeprazole (PRILOSEC) 20 MG capsule Take 1 capsule (20 mg total) by mouth daily. 10/23/12   Gerhard Munch, MD  sucralfate (CARAFATE) 1 GM/10ML suspension Take 10 mLs (1 g total) by mouth every 4 (four) hours as needed (abdominal  pain). 10/23/12   Gerhard Munch, MD  traMADol (ULTRAM) 50 MG tablet Take 50 mg by mouth every 6 (six) hours as needed. Pain.    Historical Provider, MD  vitamin B-12 (CYANOCOBALAMIN) 500 MCG tablet Take 500 mcg by mouth daily.    Historical Provider, MD  Vitamin D, Ergocalciferol, (DRISDOL) 50000 UNITS CAPS Take 50,000 Units by mouth every 7 (seven) days.    Historical Provider, MD    Allergies:   Allergies  Allergen Reactions  . Baycol [Cerivastatin]   . Ciprofloxacin   . Decadron [Dexamethasone]   . Imipramine   . Oxycodone   . Premarin [Conjugated Estrogens]   . Synthroid [Levothyroxine]     Social History:  reports that she has never smoked. She does not have any smokeless tobacco history on file. She reports that she does not drink alcohol or use illicit drugs.  Family History: History reviewed. No pertinent family history.  Physical Exam: Filed Vitals:   07/11/14 0330 07/11/14 0339 07/11/14 0415 07/11/14 0430  BP: 158/87   155/78  Pulse:   93 88  Temp:  98.1 F (36.7 C)    TempSrc:      Resp: 19  20 19   Height:      Weight:      SpO2:   93% 93%   General appearance: alert, cooperative and no distress Head: Normocephalic, without obvious abnormality, atraumatic Eyes: negative Nose: Nares  normal. Septum midline. Mucosa normal. No drainage or sinus tenderness. Neck: no JVD and supple, symmetrical, trachea midline Lungs: clear to auscultation bilaterally Heart: regular rate and rhythm, S1, S2 normal, no murmur, click, rub or gallop Abdomen: soft, non-tender; bowel sounds normal; no masses,  no organomegaly Extremities: extremities normal, atraumatic, no cyanosis or edema Pulses: 2+ and symmetric Skin: Skin color, texture, turgor normal. No rashes or lesions Neurologic: Grossly normal    Labs on Admission:   Recent Labs  07/11/14 0344  NA 140  K 3.7  CL 102  CO2 27  GLUCOSE 108*  BUN 18  CREATININE 0.82  CALCIUM 9.7    Recent Labs  07/11/14 0344   AST 12  ALT 7  ALKPHOS 74  BILITOT 0.4  PROT 6.5  ALBUMIN 3.7    Recent Labs  07/11/14 0344  WBC 9.5  NEUTROABS 6.9  HGB 13.6  HCT 40.1  MCV 88.3  PLT 191    Recent Labs  07/11/14 0344 07/11/14 0400  TROPONINI <0.30 <0.30   Radiological Exams on Admission: Ct Head Wo Contrast  07/11/2014   CLINICAL DATA:  Code stroke. Left arm and elbow numbness and pain. Weakness.  EXAM: CT HEAD WITHOUT CONTRAST  TECHNIQUE: Contiguous axial images were obtained from the base of the skull through the vertex without intravenous contrast.  COMPARISON:  None.  FINDINGS: There is no evidence of acute infarction, mass lesion, or intra- or extra-axial hemorrhage on CT.  Prominence of the ventricles and sulci reflects mild cortical volume loss. Mild cerebellar atrophy is noted. Mild periventricular white matter change likely reflects small vessel ischemic microangiopathy.  The brainstem and fourth ventricle are within normal limits. The basal ganglia are unremarkable in appearance. The cerebral hemispheres demonstrate grossly normal gray-white differentiation. No mass effect or midline shift is seen.  There is no evidence of fracture; visualized osseous structures are unremarkable in appearance. The orbits are within normal limits. The paranasal sinuses and mastoid air cells are well-aerated. No significant soft tissue abnormalities are seen.  IMPRESSION: 1. No acute intracranial pathology seen on CT. 2. Mild cortical volume loss and scattered small vessel ischemic microangiopathy.  These results were called by telephone at the time of interpretation on 07/11/2014 at 3:18 am to Dr. Paula LibraJOHN MOLPUS, who verbally acknowledged these results.   Electronically Signed   By: Roanna RaiderJeffery  Chang M.D.   On: 07/11/2014 03:19    Assessment/Plan  80 yo female with tia/cva  Principal Problem:   CVA (cerebral infarction)/TIA-  Transfer pt to [facility] for neuro w/u.  Mri, echo in am.  [facility] would not allow me to order carotid  doppler [facility] this will have to be ordered once at [facility] to complete w/u.  Place on aggrenox.  [facility] consult at [facility] already[facility].  Active Problems:   Numbness and tingling in left arm    Dantonio Justen A 07/11/2014, 4:49 AM

## 2014-07-11 NOTE — Consult Note (Signed)
Referring Physician: Landis GandySAMTANI, J    Chief Complaint: Numbness and tingling involving left upper extremity.  HPI: Alexandria Castillo is an 80 y.o. female history of hyperlipidemia, pulmonary embolus and hiatal hernia, presenting with new onset of tingling and numbness involving left upper extremity. Onset was at 10:30 PM on 07/10/2014. She has no previous history of stroke nor TIA. She's been taking aspirin 325 mg per day. CT scan of her head showed no acute intracranial abnormality. NIH stroke score was 2.  LSN: 10:30 PM on 07/10/2014 tPA Given: No: Mild deficits which were improving in the emergency room. MRankin: 1  Past Medical History  Diagnosis Date  . Hiatal hernia   . Hypercholesterolemia   . Fractured tibia   . Hip pain   . PE (pulmonary embolism)   . Complication of anesthesia   . PONV (postoperative nausea and vomiting)     History reviewed. No pertinent family history.   Medications: I have reviewed the patient's current medications.  ROS: History obtained from the patient  General ROS: negative for - chills, fatigue, fever, night sweats, weight gain or weight loss Psychological ROS: negative for - behavioral disorder, hallucinations, memory difficulties, mood swings or suicidal ideation Ophthalmic ROS: negative for - blurry vision, double vision, eye pain or loss of vision ENT ROS: negative for - epistaxis, nasal discharge, oral lesions, sore throat, tinnitus or vertigo Allergy and Immunology ROS: negative for - hives or itchy/watery eyes Hematological and Lymphatic ROS: negative for - bleeding problems, bruising or swollen lymph nodes Endocrine ROS: negative for - galactorrhea, hair pattern changes, polydipsia/polyuria or temperature intolerance Respiratory ROS: negative for - cough, hemoptysis, shortness of breath or wheezing Cardiovascular ROS: negative for - chest pain, dyspnea on exertion, edema or irregular heartbeat Gastrointestinal ROS: negative for - abdominal  pain, diarrhea, hematemesis, nausea/vomiting or stool incontinence Genito-Urinary ROS: negative for - dysuria, hematuria, incontinence or urinary frequency/urgency Musculoskeletal ROS: Chronic pain and swelling involving left leg since tibial fracture 7 years ago Neurological ROS: as noted in HPI Dermatological ROS: negative for rash and skin lesion changes  Physical Examination: Blood pressure 141/68, pulse 92, temperature 98.1 F (36.7 C), temperature source Oral, resp. rate 19, height 5\' 3"  (1.6 m), weight 87.363 kg (192 lb 9.6 oz), SpO2 96.00%.  Neurologic Examination: Mental Status: Alert, oriented, thought content appropriate.  Speech fluent without evidence of aphasia. Able to follow commands without difficulty. Cranial Nerves: II-Visual fields were normal. III/IV/VI-Pupils were equal and reacted. Extraocular movements were full and conjugate.    V/VII-no facial numbness and no facial weakness. VIII-normal. X-normal speech and symmetrical palatal movement. Motor: 5/5 bilaterally with normal tone and bulk Sensory: Reduced perception of tactile sensation involving left upper extremity compared to the right. Deep Tendon Reflexes: 1+ and symmetric. Plantars: Flexor bilaterally Cerebellar: Moderately impaired finger to nose testing with use of left upper extremity.  Ct Head Wo Contrast  07/11/2014   CLINICAL DATA:  Code stroke. Left arm and elbow numbness and pain. Weakness.  EXAM: CT HEAD WITHOUT CONTRAST  TECHNIQUE: Contiguous axial images were obtained from the base of the skull through the vertex without intravenous contrast.  COMPARISON:  None.  FINDINGS: There is no evidence of acute infarction, mass lesion, or intra- or extra-axial hemorrhage on CT.  Prominence of the ventricles and sulci reflects mild cortical volume loss. Mild cerebellar atrophy is noted. Mild periventricular white matter change likely reflects small vessel ischemic microangiopathy.  The brainstem and fourth  ventricle are within normal limits. The  basal ganglia are unremarkable in appearance. The cerebral hemispheres demonstrate grossly normal gray-white differentiation. No mass effect or midline shift is seen.  There is no evidence of fracture; visualized osseous structures are unremarkable in appearance. The orbits are within normal limits. The paranasal sinuses and mastoid air cells are well-aerated. No significant soft tissue abnormalities are seen.  IMPRESSION: 1. No acute intracranial pathology seen on CT. 2. Mild cortical volume loss and scattered small vessel ischemic microangiopathy.  These results were called by telephone at the time of interpretation on 07/11/2014 at 3:18 am to Dr. Paula Libra, who verbally acknowledged these results.   Electronically Signed   By: Roanna Raider M.D.   On: 07/11/2014 03:19    Assessment: 80 y.o. female presenting with probable right subcortical MCA territory small vessel ischemic infarction.  Stroke Risk Factors - hyperlipidemia  Plan: 1. HgbA1c, fasting lipid panel 2. MRI, MRA  of the brain without contrast 3. PT consult, OT consult 4. Echocardiogram 5. Carotid dopplers 6. Prophylactic therapy-Antiplatelet med: Aspirin 325 mg per day, for now 7. Risk factor modification 8. Telemetry monitoring   C.R. Roseanne Reno, MD [facility] [phone]  07/11/2014, 9:02 AM

## 2014-07-11 NOTE — ED Notes (Signed)
Patient took 50 mg tramadol for chronic left left leg pain. Dr. Read DriversMolpus stated was okay for patient to take medication.

## 2014-07-11 NOTE — Progress Notes (Signed)
Called to patient room for reports of "vomitting".  Family had given patient a drink and she was spitting it back up.  Patient sitting in bed at 60 degree angle.  Patient states that her baseline is the need to sit upright when swallowing.  Assisted to 90 degrees and given a sip with no swallowing problems.  Cautioned and instructed patient and family as to risks and consequences of aspiration.

## 2014-07-11 NOTE — Evaluation (Signed)
SLP Cancellation Note  Patient Details Name: Durenda HurtMary B Massie MRN: [id number] DOB: 02/14/1929   Cancelled treatment:       Reason Eval/Treat Not Completed:  (orders for 07/12/14 Speech language evaluation)   Mills KollerKimball, Kirtan Sada Ann Eloise Picone, MS [facility] SLP [phone]

## 2014-07-11 NOTE — Progress Notes (Signed)
9:02 AM I agree with HPI/GPe and A/P per Dr. Onalee Huaavid  80 y/o ?, h.o GIB in 2013 while on asa, prior h/o dysphagia, prior pulm embolism admitted early a.m. 07/11/14 with tingling left arm this weakness as well as pass pointing. Admitted to rule out CVA  Patient Active Problem List   Diagnosis Date Noted  . CVA (cerebral infarction) 07/11/2014  . Numbness and tingling in left arm 07/11/2014  . PE (pulmonary embolism)   . Hematemesis 11/06/2012  . Dysphagia 11/06/2012   Family of bedside and understand the course of care including MRI, carotids, echocardiogram Discussed briefly with neurology given prior hematemesis on aspirin and recommends full-strength aspirin for now instead of Aggrenox Family aware may need 2-3 days in hospital Will get PT eval  [name], MD [facility] [phone]) [phone]

## 2014-07-11 NOTE — ED Provider Notes (Signed)
CSN: [id number]     Arrival date & time 07/11/14  0149 History   First MD Initiated Contact with Patient 07/11/14 0245     Chief Complaint  Patient presents with  . Arm Weakness      (Consider location/radiation/quality/duration/timing/severity/associated sxs/prior Treatment) HPI This is an 80 year old female went to bed yesterday evening about 9:30 PM. About 10:30 PM she went to use the phone and found that she was having difficulty grabbing the phone. She continues to have difficulty using the left arm. There is also associated paresthesias about the left elbow. She is not sure of her left leg is acutely weak because she has chronic weakness in her left leg status post fracture. She denies pain in her left upper extremity. She has chronic edema of the lower legs.  Past Medical History  Diagnosis Date  . Hiatal hernia   . Hypercholesterolemia   . Fractured tibia   . Hip pain   . PE (pulmonary embolism)   . Complication of anesthesia   . PONV (postoperative nausea and vomiting)    Past Surgical History  Procedure Laterality Date  . Abdominal hysterectomy    . Leg surgery    . Esophagogastroduodenoscopy (egd) with esophageal dilation  12/04/2012    Procedure: ESOPHAGOGASTRODUODENOSCOPY (EGD) WITH ESOPHAGEAL DILATION;  Surgeon: Malissa HippoNajeeb U Rehman, MD;  Location: [facility];  Service: Endoscopy;  Laterality: N/A;  255-rescheduled to 13:00 Ann to notify pt   History reviewed. No pertinent family history. History  Substance Use Topics  . Smoking status: Never Smoker   . Smokeless tobacco: Not on file  . Alcohol Use: No   OB History   Grav Para Term Preterm Abortions TAB SAB Ect Mult Living                 Review of Systems  All other systems reviewed and are negative.   Allergies  Baycol; Ciprofloxacin; Decadron; Imipramine; Oxycodone; Premarin; and Synthroid  Home Medications   Prior to Admission medications   Medication Sig Start Date End Date Taking? Authorizing  Provider  meloxicam (MOBIC) 7.5 MG tablet Take 7.5 mg by mouth daily.   Yes Historical Provider, MD  Calcium Carbonate-Vitamin D (CALCIUM 600 + D PO) Take 1 tablet by mouth daily.    Historical Provider, MD  nystatin cream (MYCOSTATIN) Apply 1 application topically 2 (two) times daily.    Historical Provider, MD  Omega-3 Fatty Acids (FISH OIL) 1000 MG CAPS Take 2 capsules by mouth daily.    Historical Provider, MD  omeprazole (PRILOSEC) 20 MG capsule Take 1 capsule (20 mg total) by mouth daily. 10/23/12   Gerhard Munchobert Lockwood, MD  sucralfate (CARAFATE) 1 GM/10ML suspension Take 10 mLs (1 g total) by mouth every 4 (four) hours as needed (abdominal pain). 10/23/12   Gerhard Munchobert Lockwood, MD  traMADol (ULTRAM) 50 MG tablet Take 50 mg by mouth every 6 (six) hours as needed. Pain.    Historical Provider, MD  vitamin B-12 (CYANOCOBALAMIN) 500 MCG tablet Take 500 mcg by mouth daily.    Historical Provider, MD  Vitamin D, Ergocalciferol, (DRISDOL) 50000 UNITS CAPS Take 50,000 Units by mouth every 7 (seven) days.    Historical Provider, MD   BP 155/78  Pulse 88  Temp(Src) 98.1 F (36.7 C) (Oral)  Resp 19  Ht 5\' 3"  (1.6 m)  Wt 195 lb (88.451 kg)  BMI 34.55 kg/m2  SpO2 93%  Physical Exam General: Well-developed, well-nourished female in no acute distress; appearance consistent with age of  record HENT: normocephalic; atraumatic Eyes: pupils equal, round and reactive to light; extraocular muscles intact Neck: supple; no bruit heard Heart: regular rate and rhythm; no murmur Lungs: clear to auscultation bilaterally Abdomen: soft; nondistended; nontender; no masses or hepatosplenomegaly; bowel sounds present Extremities: 3+ edema of the lower legs; arthritic changes; no acute deformity Neurologic: Awake, alert and oriented; strength +5 out of 5 and symmetric in the upper extremities on gross motor test but drift present on the left; limited exam of lower extremities due to chronic edema Skin: Warm and  dry Psychiatric: Flat affect    ED Course  Procedures (including critical care time)  CRITICAL CARE Performed by: Niquita Digioia L Total critical care time: 30 minutes Critical care time was exclusive of separately billable procedures and treating other patients. Critical care was necessary to treat or prevent imminent or life-threatening deterioration. Critical care was time spent personally by me on the following activities: development of treatment plan with patient and/or surrogate as well as nursing, discussions with consultants, evaluation of patient's response to treatment, examination of patient, obtaining history from patient or surrogate, ordering and performing treatments and interventions, ordering and review of laboratory studies, ordering and review of radiographic studies, pulse oximetry and re-evaluation of patient's condition.   MDM  Nursing notes and vitals signs, including pulse oximetry, reviewed.  Summary of this visit's results, reviewed by myself:   EKG Interpretation  Date/Time:  Saturday July 11 2014 03:22:54 EDT Ventricular Rate:  95 PR Interval:  176 QRS Duration: 130 QT Interval:  364 QTC Calculation: 458 R Axis:   19 Text Interpretation:  Sinus rhythm Right bundle branch block Baseline wander in lead(s) V1 Rate is now regular Confirmed by Deem Marmol  MD, Jonny Ruiz ([id number]) on 07/11/2014 3:34:55 AM       Labs:  Results for orders placed during the hospital encounter of 07/11/14 (from the past 24 hour(s))  PROTIME-INR     Status: None   Collection Time    07/11/14  3:44 AM      Result Value Ref Range   Prothrombin Time 12.4  11.6 - 15.2 seconds   INR 0.92  0.00 - 1.49  APTT     Status: None   Collection Time    07/11/14  3:44 AM      Result Value Ref Range   aPTT 29  24 - 37 seconds  CBC     Status: Abnormal   Collection Time    07/11/14  3:44 AM      Result Value Ref Range   WBC 9.5  4.0 - 10.5 K/uL   RBC 4.54  3.87 - 5.11 MIL/uL   Hemoglobin 13.6   12.0 - 15.0 g/dL   HCT 60.4  54.0 - 98.1 %   MCV 88.3  78.0 - 100.0 fL   MCH 30.0  26.0 - 34.0 pg   MCHC 33.9  30.0 - 36.0 g/dL   RDW 19.1 (*) 47.8 - 29.5 %   Platelets 191  150 - 400 K/uL  DIFFERENTIAL     Status: None   Collection Time    07/11/14  3:44 AM      Result Value Ref Range   Neutrophils Relative % 73  43 - 77 %   Neutro Abs 6.9  1.7 - 7.7 K/uL   Lymphocytes Relative 19  12 - 46 %   Lymphs Abs 1.8  0.7 - 4.0 K/uL   Monocytes Relative 6  3 - 12 %   Monocytes Absolute  0.6  0.1 - 1.0 K/uL   Eosinophils Relative 2  0 - 5 %   Eosinophils Absolute 0.2  0.0 - 0.7 K/uL   Basophils Relative 0  0 - 1 %   Basophils Absolute 0.0  0.0 - 0.1 K/uL  COMPREHENSIVE METABOLIC PANEL     Status: Abnormal   Collection Time    07/11/14  3:44 AM      Result Value Ref Range   Sodium 140  137 - 147 mEq/L   Potassium 3.7  3.7 - 5.3 mEq/L   Chloride 102  96 - 112 mEq/L   CO2 27  19 - 32 mEq/L   Glucose, Bld 108 (*) 70 - 99 mg/dL   BUN 18  6 - 23 mg/dL   Creatinine, Ser 1.61  0.50 - 1.10 mg/dL   Calcium 9.7  8.4 - 09.6 mg/dL   Total Protein 6.5  6.0 - 8.3 g/dL   Albumin 3.7  3.5 - 5.2 g/dL   AST 12  0 - 37 U/L   ALT 7  0 - 35 U/L   Alkaline Phosphatase 74  39 - 117 U/L   Total Bilirubin 0.4  0.3 - 1.2 mg/dL   GFR calc non Af Amer 65 (*) >90 mL/min   GFR calc Af Amer 75 (*) >90 mL/min   Anion gap 11  5 - 15  TROPONIN I     Status: None   Collection Time    07/11/14  3:44 AM      Result Value Ref Range   Troponin I <0.30  <0.30 ng/mL  TROPONIN I     Status: None   Collection Time    07/11/14  4:00 AM      Result Value Ref Range   Troponin I <0.30  <0.30 ng/mL    Imaging Studies: Ct Head Wo Contrast  07/11/2014   CLINICAL DATA:  Code stroke. Left arm and elbow numbness and pain. Weakness.  EXAM: CT HEAD WITHOUT CONTRAST  TECHNIQUE: Contiguous axial images were obtained from the base of the skull through the vertex without intravenous contrast.  COMPARISON:  None.  FINDINGS: There  is no evidence of acute infarction, mass lesion, or intra- or extra-axial hemorrhage on CT.  Prominence of the ventricles and sulci reflects mild cortical volume loss. Mild cerebellar atrophy is noted. Mild periventricular white matter change likely reflects small vessel ischemic microangiopathy.  The brainstem and fourth ventricle are within normal limits. The basal ganglia are unremarkable in appearance. The cerebral hemispheres demonstrate grossly normal gray-white differentiation. No mass effect or midline shift is seen.  There is no evidence of fracture; visualized osseous structures are unremarkable in appearance. The orbits are within normal limits. The paranasal sinuses and mastoid air cells are well-aerated. No significant soft tissue abnormalities are seen.  IMPRESSION: 1. No acute intracranial pathology seen on CT. 2. Mild cortical volume loss and scattered small vessel ischemic microangiopathy.  These results were called by telephone at the time of interpretation on 07/11/2014 at 3:18 am to Dr. Paula Libra, who verbally acknowledged these results.   Electronically Signed   By: Roanna Raider M.D.   On: 07/11/2014 03:19    Code stroke called immediately after initial evaluation.  3:51 AM The on call neurologist has evaluated the patient and determined she likely had a small right-sided stroke but she is outside the window for thrombolysis. He advises admission for further stroke workup and we will transfer to [facility].  Hanley Seamen, MD 07/11/14 [phone]

## 2014-07-11 NOTE — Progress Notes (Signed)
VASCULAR LAB PRELIMINARY  PRELIMINARY  PRELIMINARY  PRELIMINARY  Carotid Dopplers completed.    Preliminary report:  1-39% ICA stenosis.  Vertebral artery flow is antegrade.  Erisha Paugh, RVT 07/11/2014, 3:20 PM

## 2014-07-12 ENCOUNTER — Encounter (HOSPITAL_COMMUNITY): Payer: Self-pay

## 2014-07-12 ENCOUNTER — Encounter (HOSPITAL_COMMUNITY)
Admission: EM | Disposition: A | Discharge status: Home / Self Care | Payer: Medicare PPO | Source: Home / Self Care | Attending: Cardiology

## 2014-07-12 ENCOUNTER — Ambulatory Visit (HOSPITAL_COMMUNITY): Admit: 2014-07-12 | Payer: Medicare PPO | Admitting: Cardiology

## 2014-07-12 DIAGNOSIS — Z993 Dependence on wheelchair: Secondary | ICD-10-CM | POA: Diagnosis not present

## 2014-07-12 DIAGNOSIS — M549 Dorsalgia, unspecified: Secondary | ICD-10-CM | POA: Diagnosis not present

## 2014-07-12 DIAGNOSIS — I2510 Atherosclerotic heart disease of native coronary artery without angina pectoris: Secondary | ICD-10-CM

## 2014-07-12 DIAGNOSIS — R29898 Other symptoms and signs involving the musculoskeletal system: Secondary | ICD-10-CM | POA: Diagnosis present

## 2014-07-12 DIAGNOSIS — I6340 Cerebral infarction due to embolism of unspecified cerebral artery: Secondary | ICD-10-CM | POA: Diagnosis present

## 2014-07-12 DIAGNOSIS — Z888 Allergy status to other drugs, medicaments and biological substances status: Secondary | ICD-10-CM | POA: Diagnosis not present

## 2014-07-12 DIAGNOSIS — Z9861 Coronary angioplasty status: Secondary | ICD-10-CM

## 2014-07-12 DIAGNOSIS — Z885 Allergy status to narcotic agent status: Secondary | ICD-10-CM | POA: Diagnosis not present

## 2014-07-12 DIAGNOSIS — I4729 Other ventricular tachycardia: Secondary | ICD-10-CM | POA: Diagnosis not present

## 2014-07-12 DIAGNOSIS — E785 Hyperlipidemia, unspecified: Secondary | ICD-10-CM | POA: Diagnosis present

## 2014-07-12 DIAGNOSIS — I2119 ST elevation (STEMI) myocardial infarction involving other coronary artery of inferior wall: Secondary | ICD-10-CM

## 2014-07-12 DIAGNOSIS — Z86711 Personal history of pulmonary embolism: Secondary | ICD-10-CM | POA: Diagnosis not present

## 2014-07-12 DIAGNOSIS — I059 Rheumatic mitral valve disease, unspecified: Secondary | ICD-10-CM

## 2014-07-12 DIAGNOSIS — Z955 Presence of coronary angioplasty implant and graft: Secondary | ICD-10-CM

## 2014-07-12 DIAGNOSIS — Z7982 Long term (current) use of aspirin: Secondary | ICD-10-CM | POA: Diagnosis not present

## 2014-07-12 DIAGNOSIS — Z881 Allergy status to other antibiotic agents status: Secondary | ICD-10-CM | POA: Diagnosis not present

## 2014-07-12 DIAGNOSIS — I472 Ventricular tachycardia: Secondary | ICD-10-CM | POA: Diagnosis not present

## 2014-07-12 HISTORY — PX: LEFT HEART CATHETERIZATION WITH CORONARY ANGIOGRAM: SHX5451

## 2014-07-12 LAB — VITAMIN B12: Vitamin B-12: 408 pg/mL (ref 211–911)

## 2014-07-12 LAB — T4, FREE: FREE T4: 1.06 ng/dL (ref 0.80–1.80)

## 2014-07-12 LAB — TROPONIN I: Troponin I: >20 ng/mL (ref <0.30)

## 2014-07-12 LAB — FOLATE: Folate: 15 ng/mL

## 2014-07-12 LAB — TSH: TSH: 3.22 u[IU]/mL (ref 0.350–4.500)

## 2014-07-12 LAB — CBC
HCT: 38.4 % (ref 36.0–46.0)
Hemoglobin: 13.2 g/dL (ref 12.0–15.0)
MCH: 29.9 pg (ref 26.0–34.0)
MCHC: 34.4 g/dL (ref 30.0–36.0)
MCV: 86.9 fL (ref 78.0–100.0)
PLATELETS: 181 10*3/uL (ref 150–400)
RBC: 4.42 MIL/uL (ref 3.87–5.11)
RDW: 16.3 % — AB (ref 11.5–15.5)
WBC: 9.8 10*3/uL (ref 4.0–10.5)

## 2014-07-12 LAB — CREATININE, SERUM
Creatinine, Ser: 0.62 mg/dL (ref 0.50–1.10)
GFR calc Af Amer: >90 mL/min (ref >90)
GFR calc non Af Amer: 82 mL/min — ABNORMAL LOW (ref >90)

## 2014-07-12 SURGERY — LEFT HEART CATHETERIZATION WITH CORONARY ANGIOGRAM
Anesthesia: LOCAL

## 2014-07-12 MED ORDER — ALPRAZOLAM 0.5 MG PO TABS
0.5000 mg | ORAL_TABLET | Freq: Three times a day (TID) | ORAL | Status: DC | PRN
  Administered 2014-07-12 – 2014-07-13 (×3): 0.5 mg via ORAL
  Filled 2014-07-12 (×3): qty 1

## 2014-07-12 MED ORDER — ATROPINE SULFATE 0.1 MG/ML IJ SOLN
INTRAMUSCULAR | Status: AC
  Administered 2014-07-12: 04:00:00
  Filled 2014-07-12: qty 10

## 2014-07-12 MED ORDER — FENTANYL CITRATE 0.05 MG/ML IJ SOLN
INTRAMUSCULAR | Status: AC
  Filled 2014-07-12: qty 2

## 2014-07-12 MED ORDER — SODIUM CHLORIDE 0.9 % IJ SOLN: 3.0000 mL | INTRAMUSCULAR | Status: DC | PRN

## 2014-07-12 MED ORDER — ASPIRIN 81 MG PO CHEW
81.0000 mg | CHEWABLE_TABLET | Freq: Every day | ORAL | Status: DC
Start: 2014-07-12 — End: 2014-07-14
  Administered 2014-07-12 – 2014-07-14 (×3): 81 mg via ORAL
  Filled 2014-07-12 (×3): qty 1

## 2014-07-12 MED ORDER — ACETAMINOPHEN 325 MG PO TABS
650.0000 mg | ORAL_TABLET | ORAL | Status: DC | PRN
Start: 2014-07-12 — End: 2014-07-14

## 2014-07-12 MED ORDER — SODIUM CHLORIDE 0.9 % IV SOLN
1.7500 mg/kg/h | INTRAVENOUS | Status: AC
  Filled 2014-07-12: qty 250

## 2014-07-12 MED ORDER — HEPARIN (PORCINE) IN NACL 2-0.9 UNIT/ML-% IJ SOLN
INTRAMUSCULAR | Status: AC
  Filled 2014-07-12: qty 1000

## 2014-07-12 MED ORDER — WHITE PETROLATUM GEL
Status: AC
  Administered 2014-07-12: 0.2
  Filled 2014-07-12: qty 5

## 2014-07-12 MED ORDER — LIDOCAINE HCL (PF) 1 % IJ SOLN
INTRAMUSCULAR | Status: AC
  Filled 2014-07-12: qty 30

## 2014-07-12 MED ORDER — NITROGLYCERIN 1 MG/10 ML FOR IR/CATH LAB
INTRA_ARTERIAL | Status: AC
  Filled 2014-07-12: qty 10

## 2014-07-12 MED ORDER — ENOXAPARIN SODIUM 40 MG/0.4ML ~~LOC~~ SOLN
40.0000 mg | SUBCUTANEOUS | Status: DC
  Administered 2014-07-13 – 2014-07-14 (×2): 40 mg via SUBCUTANEOUS
  Filled 2014-07-12 (×3): qty 0.4

## 2014-07-12 MED ORDER — MIDAZOLAM HCL 2 MG/2ML IJ SOLN
INTRAMUSCULAR | Status: AC
  Filled 2014-07-12: qty 2

## 2014-07-12 MED ORDER — MORPHINE SULFATE 2 MG/ML IJ SOLN: 1.0000 mg | INTRAMUSCULAR | Status: DC | PRN

## 2014-07-12 MED ORDER — ASPIRIN 81 MG PO CHEW: 324.0000 mg | CHEWABLE_TABLET | ORAL | Status: AC

## 2014-07-12 MED ORDER — ASPIRIN 81 MG PO CHEW
CHEWABLE_TABLET | ORAL | Status: AC
  Administered 2014-07-12: 05:00:00
  Filled 2014-07-12: qty 4

## 2014-07-12 MED ORDER — ATORVASTATIN CALCIUM 10 MG PO TABS
10.0000 mg | ORAL_TABLET | Freq: Every day | ORAL | Status: DC
  Administered 2014-07-12 – 2014-07-13 (×2): 10 mg via ORAL
  Filled 2014-07-12 (×3): qty 1

## 2014-07-12 MED ORDER — BIVALIRUDIN 250 MG IV SOLR
INTRAVENOUS | Status: AC
  Filled 2014-07-12: qty 250

## 2014-07-12 MED ORDER — MORPHINE SULFATE 2 MG/ML IJ SOLN
2.0000 mg | INTRAMUSCULAR | Status: DC | PRN
Start: 2014-07-12 — End: 2014-07-12
  Administered 2014-07-12 (×2): 2 mg via INTRAVENOUS
  Filled 2014-07-12 (×2): qty 1

## 2014-07-12 MED ORDER — HYDRALAZINE HCL 20 MG/ML IJ SOLN: 10.0000 mg | INTRAMUSCULAR | Status: DC | PRN

## 2014-07-12 MED ORDER — ONDANSETRON HCL 4 MG/2ML IJ SOLN: 4.0000 mg | Freq: Four times a day (QID) | INTRAMUSCULAR | Status: DC | PRN

## 2014-07-12 MED ORDER — TICAGRELOR 90 MG PO TABS
90.0000 mg | ORAL_TABLET | Freq: Two times a day (BID) | ORAL | Status: DC
  Administered 2014-07-12 – 2014-07-14 (×5): 90 mg via ORAL
  Filled 2014-07-12 (×6): qty 1

## 2014-07-12 MED ORDER — SODIUM CHLORIDE 0.9 % IJ SOLN
3.0000 mL | Freq: Two times a day (BID) | INTRAMUSCULAR | Status: DC
  Administered 2014-07-12 – 2014-07-14 (×4): 3 mL via INTRAVENOUS

## 2014-07-12 MED ORDER — MORPHINE SULFATE 2 MG/ML IJ SOLN
2.0000 mg | INTRAMUSCULAR | Status: DC | PRN
  Administered 2014-07-12 – 2014-07-14 (×4): 2 mg via INTRAVENOUS
  Filled 2014-07-12 (×4): qty 1

## 2014-07-12 MED ORDER — SODIUM CHLORIDE 0.9 % IV SOLN
1.0000 mL/kg/h | INTRAVENOUS | Status: AC
  Administered 2014-07-12: 1 mL/kg/h via INTRAVENOUS

## 2014-07-12 MED ORDER — SODIUM CHLORIDE 0.9 % IV SOLN: 250.0000 mL | INTRAVENOUS | Status: DC | PRN

## 2014-07-12 MED ORDER — METOPROLOL TARTRATE 12.5 MG HALF TABLET
12.5000 mg | ORAL_TABLET | Freq: Two times a day (BID) | ORAL | Status: DC
  Administered 2014-07-12 – 2014-07-14 (×4): 12.5 mg via ORAL
  Filled 2014-07-12 (×8): qty 1

## 2014-07-12 MED ORDER — HYDRALAZINE HCL 20 MG/ML IJ SOLN
INTRAMUSCULAR | Status: AC
  Administered 2014-07-12: 10 mg
  Filled 2014-07-12: qty 1

## 2014-07-12 MED FILL — Medication: Qty: 1 | Status: AC

## 2014-07-12 NOTE — Progress Notes (Addendum)
R fem art sheath pulled, manual pressure held x 20 minutes. Hemostasis achieved, No signs/symptoms of bleeding. Positive distal pulse 1+  , Pt tolerated well

## 2014-07-12 NOTE — Progress Notes (Signed)
Orthopedic Tech Progress Note Patient Details:  Alexandria Castillo 07/07/1929 [id number]  Ortho Devices Type of Ortho Device: Knee Immobilizer Ortho Device/Splint Interventions: Application   Alexandria Castillo, Alexandria Castillo 07/12/2014, 1:07 PM

## 2014-07-12 NOTE — Progress Notes (Signed)
Note: This document was prepared with digital dictation and possible smart phrase technology. Any transcriptional errors that result from this process are unintentional.   Alexandria Castillo WGN:[id number] DOB: [birth year] DOA: 07/11/2014 PCP: Kathlee Nations, MD  Brief narrative: 80 y/o ?, h.o GIB in 2013 while on asa, prior h/o dysphagia, prior pulm embolism admitted early a.m. 07/11/14 with tingling left arm this weakness as well as pass pointing.  Admitted to rule out CVA Overnight 7/26 had chest pain EKG changes and symptomatic bradycardia with angina.  Underwent LHC =Severe single-vessel disease with thrombotic subtotal occlusion of the mid RCA treated successfully with a single Promus. DES stent 3.5 mm and 15 mm postdilated at 3.8 mm.  Past medical history-As per Problem list Chart reviewed as below- Reviewed  Consultants:  Cardiology  Neurology  Procedures: Cardiac catheterization   Severe single-vessel disease with thrombotic subtotal occlusion of the mid RCA treated successfully with a single Promus. DES stent 3.5 mm and 15 mm postdilated at 3.8 mm.  Otherwise only mild/moderate focal disease in the mid LAD with minimal residual disease.  Well-preserved LVEF with basal inferior hypokinesis, but relatively normal LVEDP  Short run of AIVR following reperfusion, then no additional arrhythmias noted   Antibiotics:  None   Subjective  In some discomfort. Wants to drink and eat. Tolerating pain poorly and is being medicated Daughter is a   Objective    Interim History:   Telemetry:    Objective: Filed Vitals:   07/12/14 0450 07/12/14 0500 07/12/14 0600 07/12/14 0700  BP:   144/94 149/70  Pulse: 78   71  Temp:  97.9 F (36.6 C)    TempSrc:      Resp:   19 17  Height:      Weight:      SpO2:  100%  100%    Intake/Output Summary (Last 24 hours) at 07/12/14 7846 Last data filed at 07/12/14 0700  Gross per 24 hour  Intake 116.63 ml  Output    350 ml  Net -233.37  ml    Exam:  General: EOMI NCAT Cardiovascular: S1-S2 no murmur rub or gallop Respiratory: Poor exam was cannot sit up secondary to cath but anterior chest is negative Abdomen: Soft nontender Skin no coolness or redness or discomfort Neuro past-pointing noted of her left and right extremities smile symmetrical, extraocular movements intact  Data Reviewed: Basic Metabolic Panel:  Recent Labs Lab 07/11/14 0344 07/11/14 0925  NA 140  --   K 3.7  --   CL 102  --   CO2 27  --   GLUCOSE 108*  --   BUN 18  --   CREATININE 0.82 0.67  CALCIUM 9.7  --    Liver Function Tests:  Recent Labs Lab 07/11/14 0344  AST 12  ALT 7  ALKPHOS 74  BILITOT 0.4  PROT 6.5  ALBUMIN 3.7   No results found for this basename: LIPASE, AMYLASE,  in the last 168 hours No results found for this basename: AMMONIA,  in the last 168 hours CBC:  Recent Labs Lab 07/11/14 0344 07/11/14 0925  WBC 9.5 7.7  NEUTROABS 6.9  --   HGB 13.6 13.3  HCT 40.1 39.5  MCV 88.3 88.0  PLT 191 188   Cardiac Enzymes:  Recent Labs Lab 07/11/14 0344 07/11/14 0400 07/12/14 0402  TROPONINI <0.30 <0.30 >20.00*   BNP: No components found with this basename: POCBNP,  CBG: No results found for this basename: GLUCAP,  in the  last 168 hours  No results found for this or any previous visit (from the past 240 hour(s)).   Studies:              All Imaging reviewed and is as per above notation   Scheduled Meds: . aspirin  81 mg Oral Daily  . [START ON 07/13/2014] enoxaparin (LOVENOX) injection  40 mg Subcutaneous Q24H  . hydrALAZINE      . sodium chloride  3 mL Intravenous Q12H  . sodium chloride  3 mL Intravenous Q12H  . ticagrelor  90 mg Oral BID   Continuous Infusions: . sodium chloride 1 mL/kg/hr (07/12/14 0545)  . bivalirudin (ANGIOMAX) infusion 5 mg/mL (Cath Lab,ACS,PCI indication)       Assessment/Plan: 1. STEMI-status post PCI drug-eluting stent.  Further management per cardiology, continue  Brillinta 90 twice a day, aspirin 81 daily.  Metoprolol 12.5 twice a day added. 2. Likely cerebellar infarct-still await MRI brain. To be done later today. 3. Pulmonary embolism-currently stable 4. Prior history dysphagia-stable currently -continue Carafate 1 g every 6 when necessary 5. Hyperlipidemia-prior intolerance to statin, atorvastatin 10 mg added  Code Status: Full Family Communication: Discussed with daughter at bedside Disposition Plan: Inpatient septal   Pleas KochJai Shanah Guimaraes, MD  [facility] Pager [phone] 07/12/2014, 8:22 AM    LOS: 1 day

## 2014-07-12 NOTE — H&P (View-Only) (Signed)
[facility] progress note. Chief complaint. Chest pain. History of present illness. This 80-year-old female admitted with left arm weakness and numbness to rule out stroke. CT scan and did not find any acute findings an MRI is pending. Patient complained acutely of chest pain and rapid response was notified and came to the bedside. An EKG was obtained and this suggestive of an acute MI with inferior injury. I also came to the bedside to assess the patient and a code STEMI has been called. The patient complains of central chest pain 10/10 with some radiation to the upper arms bilaterally. Sinuses and her bradycardia and was administered atropine by rapid response.  Post atropine the patient has had no further bradycardia. On-call cardiologist an interventional cardiologist were notified. Doctor Harding interventional cardiologist is now at the bedside and the plan will be for a cardiac cath. Initially blood pressure somewhat soft and the patient was given a fluid bolus. Vital signs. Temperature 97.4, pulse 73, respiration 34, blood pressure initially somewhat soft but latest greater than 110/60. General appearance. Frail-appearing elderly female who is alert and in no obvious distress. She continues to complain of chest pain. Cardiac. Rate and rhythm regular. She does have peripheral edema. Lungs. Breath sounds are clear. Somewhat reduced in the bases. Abdomen. Soft with positive bowel sounds. Neurologic. Cranial nerves 2-12 grossly intact. Strengths appear equal and 5/5 bilaterally in the upper remedies. Impression/plan. Problem #1. Acute MI. Probable inferior injury. Interventional cardiologist at the bedside and the plan will be for a cardiac cath. 

## 2014-07-12 NOTE — CV Procedure (Signed)
CARDIAC CATHETERIZATION AND PERCUTANEOUS CORONARY INTERVENTION REPORT  NAME:  Alexandria Castillo   MRN: [id number] DOB:  [birth year]   ADMIT DATE: 07/11/2014 Procedure Date: 07/12/2014  INTERVENTIONAL CARDIOLOGIST: Leonie Man, M.D., MS PRIMARY CARE PROVIDER: Eber Hong, MD PRIMARY CARDIOLOGIST: New to [facility]  PATIENT:  Alexandria Castillo is a 80 y.o. female who presents for evaluation of chest pain. She was admitted to the Hospitalist service on 7/25 for left arm weakness, numbness, and tingling -- was being evaluated for possible CVA. She had no acute abnormalities on CT head. She developed chest pain 10/10 tonight. EKG [phone] (with previous ECG @ 0322was obtained which revealed RBBB with new ST elevation in inferior leads (~2 mm in II, III, aVF, V5&6). CODE STEMI was called by rapid response.   Had brief episode siginificant bradycardia HR ~ 34 in what appears to be 2:1 AVB that responded to 1/2 Amp Atropine.  She was transferred to the CCU for stabilization. Upon my arrival to the CCU (shortly after her transfer) she was still noting 8/10 anginal pain, but was in NSR with persistent STE & hemodynamically stable.  She was brought emergently to the Cardiac Cath Lab for Emergent Cardiac Cath +/- PCI & possible TPM.  When she was transferred to the Cath Lab table, she went into Ventricular Tachycardia requiring a single Defibrillation Shock of [place] with restoration of NSR & was fully responsive.  For this reason, we decided to use a femoral artery access to avoid potential difficulties with radial access catheter seating.  PRE-OPERATIVE DIAGNOSIS:    Inferior STEMI  PROCEDURES PERFORMED:    Left Heart Catheterization with Native Coronary Angiography  via Right Femoral Artery   Left Ventriculography  PROCEDURE: The patient was brought to the [facility] in the fasting state and prepped and draped in the usual sterile fashion for Right Common Femoral  artery access. Sterile technique was used including antiseptics, cap, gloves, gown, hand hygiene, mask and sheet. Skin prep: Chlorhexidine.   Consent: Risks of procedure as well as the alternatives and risks of each were explained to the patient. Verbal consent for procedure obtained as the patient was in the process of having an IV placed in the right arm and then required defibrillation.  Time Out: Verified patient identification, verified procedure, site/side was marked, verified correct patient position, special equipment/implants available, medications/allergies/relevent history reviewed, required imaging and test results available. Performed.  Access:   Right Common Femoral Artery: 6 Fr Sheath -  fluoroscopically guided modified Seldinger Technique   A Versicore wire was needed to advance the wire into the descending aorta due to tortuosity of the right iliac system. There was some delay in achieving arterial access due to her atypical anatomy. In addition due to tortuosity of the iliac system, there was also minimal femoral head.  Left Heart Catheterization: 5 and 6 Fr Catheters advanced or exchanged over a Long Exchange J-wire; 5 FrJL 4 catheter advanced first.  Left Coronary Artery Cineangiography: 5 Fr JL4 Catheter  Right Coronary Artery Cineangiography: 6 Fr JR 4 Guide Catheter   Post PCI LV Hemodynamics (LV Gram): Angled Pigtail  Sheath removed in the CCU with manual pressure for hemostasis.   FINDINGS:  Hemodynamics:   Central Aortic Pressure / Mean: 143/84/101 mmHg  Left Ventricular Pressure / LVEDP: 144/10/14 mmHg  Left Ventriculography:  EF: 55-60 %  Wall Motion: Basal inferior hypokinesis  Coronary Anatomy:  Dominance: Right  Left Main: Large caliber vessel that  bifurcates into the LAD and Circumflex. Angiographically normal with mild calcification. LAD: Normal caliber vessel with a large proximal septal perforator and 2 diagonal branches. The vessel that  reaches and wraps around the apex perfusing the distal third of the inferior apex. Minimal luminal irregularities with the exception of a focal 40% stenosis just after D2.  D1: Moderate caliber vessel that comes off just beyond the septal perforator. Minimal luminal irregularities.  D2: Moderate caliber vessel from the mid LAD. Minimal luminal irregularities. Left Circumflex: Large caliber, nondominant vessel with a proximal OM1. The main branch then terminates as a large lateral OM 2 with a very small AV groove circumflex. Minimal luminal irregularities. OM 2 bifurcates this   RCA: Large caliber, dominant vessel that is leading to the PDA and Right Posterior AV Groove Branch (RPAV). There is a focal thrombotic 99% subtotal occlusion in the mid vessel just after the RV marginal branch. There is TIMI 2 flow distally. The remainder the RCA system is free of any significant disease just mild luminal irregularities.  Post PCI angiography revealed a very extensive posterolateral system that was not fully visualized prior to stenting. There are least 3 posterolateral branches with brisk TIMI-3 flow post PCI  After reviewing the initial angiography, the culprit lesion was thought to be 99% thrombotic subtotal occlusion of the RCA.  Preparation were made to proceed with PCI on this lesion.  Percutaneous Coronary Intervention:  Diagnostic angiography was performed with the guide catheter and Lesion data: Mid-RCA 99% thrombotic occlusion - reduced to 0%;   TIMI 2 flow pre-PCI --> TIMI-3 flow post PCI  Guide: 6 Fr   JR 4 Guidewire: Pro-water Predilation Balloon: Euphora 2.5 mm x 12 mm;   10 Atm x 20 Sec - post inflation angiography revealed TIMI 3 flow distally but with significant mild localized dissection and thrombus in the residual lesion. Stent: Promus Premier DES 3.5 mm x 15 mm;   Max inflation 16 Atm x 30 Sec Post-dilation Balloon: [place] Euphora 3.75 mm x 12 mm;   16 Atm x 30 Sec, 60 Atm x 45  Sec  Final Diameter: 3.8 mm  Post deployment angiography in multiple views, with and without guidewire in place revealed excellent stent deployment and lesion coverage.  There was no evidence of dissection or perforation.  MEDICATIONS:  Anesthesia:  Local Lidocaine 10 ml  Sedation:  1 mg IV Versed, 25 mcg IV fentanyl ;   Premedication: 325 mg aspirin  Omnipaque Contrast: 165 ml  Anticoagulation: Angiomax Bolus & drip  Anti-Platelet Agent:  Brilinta 180 mg  PATIENT DISPOSITION:    The patient was transferred to the PACU holding area in a hemodynamicaly stable, chest pain free condition.  The patient tolerated the procedure well, and there were no complications.  EBL:   < 10 ml  The patient was stable before, during, and after the procedure.  POST-OPERATIVE DIAGNOSIS:    Severe single-vessel disease with thrombotic subtotal occlusion of the mid RCA treated successfully with a single Promus. DES stent 3.5 mm and 15 mm postdilated at 3.8 mm.  Otherwise only mild/moderate focal disease in the mid LAD with minimal residual disease.  Well-preserved LVEF with basal inferior hypokinesis, but relatively normal LVEDP  Short run of AIVR following reperfusion, then no additional arrhythmias noted  PLAN OF CARE:  She is already been transferred to the CCU where she will remain for post catheterization care.  Provided she remains stable over the next day or so I think she can  probably fast-track discharge as she did have partial reperfusion upon arrival to the Cath Lab and his now hemodynamic stable with normal EF and EDP  Dual independent therapy for minimum one year.  I would suspect that the left arm numbness was more of an anginal equivalent as opposed to possible stroke, however will not stop the stroke evaluation already in process.  Since she is wheelchair bound following an accident, I will use subcutaneous Lovenox for DVT prophylaxis.  She is not on statin or beta blocker.   She does have a statin allergy listed, we'll need to reassess what this actually was.  I will hold off on beta blocker until I see what her heart rate response will be post PCI.  If her heart rate remains stable would like to have one started prior to discharge.   Leonie Man, M.D., M.S. [facility] [address] [place], [place]  [address]  [phone]  07/12/2014 6:09 AM

## 2014-07-12 NOTE — Significant Event (Signed)
Rapid Response Event Note  Overview: Time Called: 0331 Arrival Time: 0333 Event Type: Cardiac  Initial Focused Assessment: C/o chest pain radiating through to back. Chest pain was 10/10. Skin was cool and diaphoretic. Noted bradycardia on telemetry at rate of 33 and irregular. EKG was being performed during assessment. Moderate ST elevation was recorded in leads III and AVF. EKG reported "Acute MI".   Interventions:On call practitioner, Lenny Pastelom Callahan, NP, was called to bedside. A Code Stemi was activated. Dr. Herbie BaltimoreHarding was made aware of patient's condition.    Event Summary:  Patient was administered Atropine 0.5mg  IV for symptomatic bradycardia. Continued on O2 at 3l/min. Placed on zoll monitor. Patient was taken to 2H 12 for while awaiting transport to the cath lab.                Alexandria Castillo, Alexandria Castillo

## 2014-07-12 NOTE — Progress Notes (Signed)
Utilization review completed.  

## 2014-07-12 NOTE — Progress Notes (Signed)
Patient seen following PCI of her right coronary artery in the setting of an acute inferior myocardial infarction. She is complaining of bilateral lower extremity pain. She is not having chest pain. Followup electrocardiogram shows sinus rhythm, right bundle branch block, inferior infarct with slight ST elevation improved from earlier. Sheath in place in right groin. She has a dopplerable pulse on the right and 2+ dorsalis pedis pulse on the left.Plan continue aspirin and brilinta. She apparently had difficulties with Lipitor in the past but does not recall the details. She is willing to try low dose again. Begin 10 mg daily. Add low-dose metoprolol. Follow.

## 2014-07-12 NOTE — Progress Notes (Signed)
[id number], pt called for drink of water.  Upon entering room, pt lying in bed, water given to pt.  Pt stated she felt hot and wanted to get up to bedside commode.  Upon sitting on side of bed, pt stated her chest hurt 10/10.  Pt assisted back in bed, CCMD notified nurse that pt heart rate in the 30s with 2nd degree heart block.  RR notified and Lenny Pastelom Callahan, NP notified.  BP 88/43, heart rate 58 and oxygen saturation 96% on 2L per [place].  EKG obtained showing STEMI as RR at bedside.  NS bolus initiated.  RR called code STEMI at bedside and Lenny Pastelom Callahan, NP at bedside.  BP 109/44 with heart rate 49 prior to transfer.  Pt transferred to 2H12 with RR and NP.  Report given to Thelma BargeFrancis, Charity fundraiserN.  Pt belongings at bedside.  Pts daughter, Jamesetta Sohyllis and son, Molly MaduroRobert notified of pt transfer.

## 2014-07-12 NOTE — Progress Notes (Signed)
PT Cancellation Note  Patient Details Name: Alexandria HurtMary B Gouveia MRN: [id number] DOB: 01/22/1929   Cancelled Treatment:    Reason Eval/Treat Not Completed: Medical issues which prohibited therapy.  Since PT order written, patient had MI last pm with cardiac cath/PCI and transferred to CCU.  Will hold PT today.  MD:  Please indicate when appropriate to initiate PT evaluation/mobility.  Thank you.   Vena AustriaDavis, Demika Langenderfer H 07/12/2014, 7:57 AM Alexandria HurtSusan H. Renaldo Fiddleravis, PT, [facility] Acute Rehab Services Pager [phone]

## 2014-07-12 NOTE — Consult Note (Signed)
Cardiology Consult Note Eber Hong, MD No ref. provider found  Reason for consult: CODE STEMI  History of Present Illness (and review of medical records): Alexandria Castillo is a 81 y.o. female who presents for evaluation of chest pain.  She was admitted to the Hospitalist service on 7/25 for left arm weakness, numbness, and tingling.  She had no acute abnormalities on CT head.  She developed chest pain 10/10 tonight.  EKG was obtained which revealed ST elevation in inferior leads.  CODE STEMI was called by rapid response.    Review of Systems Pertinent items are noted in HPI. Further review of systems was otherwise negative other than stated in HPI.  Patient Active Problem List   Diagnosis Date Noted  . CVA (cerebral infarction) 07/11/2014  . Numbness and tingling in left arm 07/11/2014  . PE (pulmonary embolism)   . Hematemesis 11/06/2012  . Dysphagia 11/06/2012   Past Medical History  Diagnosis Date  . Hiatal hernia   . Hypercholesterolemia   . Fractured tibia   . Hip pain   . PE (pulmonary embolism)   . Complication of anesthesia   . PONV (postoperative nausea and vomiting)     Past Surgical History  Procedure Laterality Date  . Abdominal hysterectomy    . Leg surgery    . Esophagogastroduodenoscopy (egd) with esophageal dilation  12/04/2012    Procedure: ESOPHAGOGASTRODUODENOSCOPY (EGD) WITH ESOPHAGEAL DILATION;  Surgeon: Rogene Houston, MD;  Location: [facility];  Service: Endoscopy;  Laterality: N/A;  255-rescheduled to 13:00 Ann to notify pt    Prescriptions prior to admission  Medication Sig Dispense Refill  . aspirin 325 MG tablet Take 325 mg by mouth daily.      . Calcium-Vitamin D (CALTRATE 600 PLUS-VIT D PO) Take 1 tablet by mouth daily.      . meloxicam (MOBIC) 7.5 MG tablet Take 7.5 mg by mouth daily.      . Omega-3 Fatty Acids (FISH OIL) 1200 MG CAPS Take 1,200 mg by mouth daily.      Marland Kitchen OVER THE COUNTER MEDICATION Take 1 capsule by mouth daily. [place] 50  Billion units      . traMADol (ULTRAM) 50 MG tablet Take 50-100 mg by mouth every 6 (six) hours as needed for moderate pain or severe pain. Pain.      . vitamin B-12 (CYANOCOBALAMIN) 500 MCG tablet Take 500 mcg by mouth daily.      . Vitamin D, Ergocalciferol, (DRISDOL) 50000 UNITS CAPS Take 50,000 Units by mouth every 7 (seven) days.       Allergies  Allergen Reactions  . Baycol [Cerivastatin]   . Ciprofloxacin   . Decadron [Dexamethasone]   . Imipramine   . Oxycodone   . Premarin [Conjugated Estrogens]   . Synthroid [Levothyroxine]     History  Substance Use Topics  . Smoking status: Never Smoker   . Smokeless tobacco: Not on file  . Alcohol Use: No    History reviewed. No pertinent family history.   Objective:  Patient Vitals for the past 8 hrs:  BP Temp Temp src Pulse Resp SpO2 Height Weight  07/12/14 0423 143/81 mmHg - - - 34 100 % 5' 3"  (1.6 m) 90.9 kg (200 lb 6.4 oz)  07/12/14 0337 109/44 mmHg - - - - - - -  07/12/14 0030 144/60 mmHg 97.3 F (36.3 C) Oral 73 18 90 % - -   General appearance: elderly appearing female in mild distress Head: Normocephalic,, atraumatic Eyes:  PERRL, EOM's intact. Neck: supple Lungs: clear to auscultation bilaterally Chest wall: no tenderness Heart: regular rate and rhythm, S1, S2 normal,  Abdomen: soft, non-tender; bowel sounds normal; no masses,  no organomegaly Extremities: extremities normal, atraumatic, no cyanosis or edema Pulses: 2+ and symmetric Neurologic: Grossly normal  Results for orders placed during the hospital encounter of 07/11/14 (from the past 48 hour(s))  PROTIME-INR     Status: None   Collection Time    07/11/14  3:44 AM      Result Value Ref Range   Prothrombin Time 12.4  11.6 - 15.2 seconds   INR 0.92  0.00 - 1.49  APTT     Status: None   Collection Time    07/11/14  3:44 AM      Result Value Ref Range   aPTT 29  24 - 37 seconds  CBC     Status: Abnormal   Collection Time    07/11/14  3:44 AM       Result Value Ref Range   WBC 9.5  4.0 - 10.5 K/uL   RBC 4.54  3.87 - 5.11 MIL/uL   Hemoglobin 13.6  12.0 - 15.0 g/dL   HCT 40.1  36.0 - 46.0 %   MCV 88.3  78.0 - 100.0 fL   MCH 30.0  26.0 - 34.0 pg   MCHC 33.9  30.0 - 36.0 g/dL   RDW 16.5 (*) 11.5 - 15.5 %   Platelets 191  150 - 400 K/uL  DIFFERENTIAL     Status: None   Collection Time    07/11/14  3:44 AM      Result Value Ref Range   Neutrophils Relative % 73  43 - 77 %   Neutro Abs 6.9  1.7 - 7.7 K/uL   Lymphocytes Relative 19  12 - 46 %   Lymphs Abs 1.8  0.7 - 4.0 K/uL   Monocytes Relative 6  3 - 12 %   Monocytes Absolute 0.6  0.1 - 1.0 K/uL   Eosinophils Relative 2  0 - 5 %   Eosinophils Absolute 0.2  0.0 - 0.7 K/uL   Basophils Relative 0  0 - 1 %   Basophils Absolute 0.0  0.0 - 0.1 K/uL  COMPREHENSIVE METABOLIC PANEL     Status: Abnormal   Collection Time    07/11/14  3:44 AM      Result Value Ref Range   Sodium 140  137 - 147 mEq/L   Potassium 3.7  3.7 - 5.3 mEq/L   Chloride 102  96 - 112 mEq/L   CO2 27  19 - 32 mEq/L   Glucose, Bld 108 (*) 70 - 99 mg/dL   BUN 18  6 - 23 mg/dL   Creatinine, Ser 0.82  0.50 - 1.10 mg/dL   Calcium 9.7  8.4 - 10.5 mg/dL   Total Protein 6.5  6.0 - 8.3 g/dL   Albumin 3.7  3.5 - 5.2 g/dL   AST 12  0 - 37 U/L   ALT 7  0 - 35 U/L   Alkaline Phosphatase 74  39 - 117 U/L   Total Bilirubin 0.4  0.3 - 1.2 mg/dL   GFR calc non Af Amer 65 (*) >90 mL/min   GFR calc Af Amer 75 (*) >90 mL/min   Comment: (NOTE)     The eGFR has been calculated using the CKD EPI equation.     This calculation has not been validated in all clinical  situations.     eGFR's persistently <90 mL/min signify possible Chronic Kidney     Disease.   Anion gap 11  5 - 15  TROPONIN I     Status: None   Collection Time    07/11/14  3:44 AM      Result Value Ref Range   Troponin I <0.30  <0.30 ng/mL   Comment:            Due to the release kinetics of cTnI,     a negative result within the first hours     of the onset  of symptoms does not rule out     myocardial infarction with certainty.     If myocardial infarction is still suspected,     repeat the test at appropriate intervals.  TROPONIN I     Status: None   Collection Time    07/11/14  4:00 AM      Result Value Ref Range   Troponin I <0.30  <0.30 ng/mL   Comment:            Due to the release kinetics of cTnI,     a negative result within the first hours     of the onset of symptoms does not rule out     myocardial infarction with certainty.     If myocardial infarction is still suspected,     repeat the test at appropriate intervals.  HEMOGLOBIN A1C     Status: None   Collection Time    07/11/14  9:25 AM      Result Value Ref Range   Hemoglobin A1C 5.6  <5.7 %   Comment: (NOTE)                                                                               According to the ADA Clinical Practice Recommendations for 2011, when     HbA1c is used as a screening test:      >=6.5%   Diagnostic of Diabetes Mellitus               (if abnormal result is confirmed)     5.7-6.4%   Increased risk of developing Diabetes Mellitus     References:Diagnosis and Classification of Diabetes Mellitus,Diabetes     TOIZ,1245,80(DXIPJ 1):S62-S69 and Standards of Medical Care in             Diabetes - 2011,Diabetes ASNK,5397,67 (Suppl 1):S11-S61.   Mean Plasma Glucose 114  <117 mg/dL   Comment: Performed at [facility]     Status: Abnormal   Collection Time    07/11/14  9:25 AM      Result Value Ref Range   Cholesterol 217 (*) 0 - 200 mg/dL   Triglycerides 79  <150 mg/dL   HDL 50  >39 mg/dL   Total CHOL/HDL Ratio 4.3     VLDL 16  0 - 40 mg/dL   LDL Cholesterol 151 (*) 0 - 99 mg/dL   Comment:            Total Cholesterol/HDL:CHD Risk     Coronary Heart Disease Risk Table  Men   Women      1/2 Average Risk   3.4   3.3      Average Risk       5.0   4.4      2 X Average Risk   9.6   7.1      3 X Average Risk   23.4   11.0                Use the calculated Patient Ratio     above and the CHD Risk Table     to determine the patient's CHD Risk.                ATP III CLASSIFICATION (LDL):      <100     mg/dL   Optimal      100-129  mg/dL   Near or Above                        Optimal      130-159  mg/dL   Borderline      160-189  mg/dL   High      >190     mg/dL   Very High  CBC     Status: Abnormal   Collection Time    07/11/14  9:25 AM      Result Value Ref Range   WBC 7.7  4.0 - 10.5 K/uL   RBC 4.49  3.87 - 5.11 MIL/uL   Hemoglobin 13.3  12.0 - 15.0 g/dL   HCT 39.5  36.0 - 46.0 %   MCV 88.0  78.0 - 100.0 fL   MCH 29.6  26.0 - 34.0 pg   MCHC 33.7  30.0 - 36.0 g/dL   RDW 16.3 (*) 11.5 - 15.5 %   Platelets 188  150 - 400 K/uL  CREATININE, SERUM     Status: Abnormal   Collection Time    07/11/14  9:25 AM      Result Value Ref Range   Creatinine, Ser 0.67  0.50 - 1.10 mg/dL   GFR calc non Af Amer 80 (*) >90 mL/min   GFR calc Af Amer >90  >90 mL/min   Comment: (NOTE)     The eGFR has been calculated using the CKD EPI equation.     This calculation has not been validated in all clinical situations.     eGFR's persistently <90 mL/min signify possible Chronic Kidney     Disease.  TSH     Status: None   Collection Time    07/12/14  3:02 AM      Result Value Ref Range   TSH 3.220  0.350 - 4.500 uIU/mL   Dg Chest 2 View  07/11/2014   CLINICAL DATA:  Stroke.  EXAM: CHEST  2 VIEW  COMPARISON:  Chest x-ray 10/23/2012.  FINDINGS: Lung volumes are normal. No consolidative airspace disease. No pleural effusions. No pneumothorax. No evidence of pulmonary edema. Mild cardiomegaly. Upper mediastinal contours are within normal limits. Atherosclerosis in the thoracic aorta.  IMPRESSION: 1. No radiographic evidence of acute cardiopulmonary disease. 2. Mild cardiomegaly. 3. Atherosclerosis.   Electronically Signed   By: Vinnie Langton M.D.   On: 07/11/2014 10:42   Ct Head Wo Contrast  07/11/2014    CLINICAL DATA:  Code stroke. Left arm and elbow numbness and pain. Weakness.  EXAM: CT HEAD WITHOUT CONTRAST  TECHNIQUE: Contiguous axial images were obtained from the base of the  skull through the vertex without intravenous contrast.  COMPARISON:  None.  FINDINGS: There is no evidence of acute infarction, mass lesion, or intra- or extra-axial hemorrhage on CT.  Prominence of the ventricles and sulci reflects mild cortical volume loss. Mild cerebellar atrophy is noted. Mild periventricular white matter change likely reflects small vessel ischemic microangiopathy.  The brainstem and fourth ventricle are within normal limits. The basal ganglia are unremarkable in appearance. The cerebral hemispheres demonstrate grossly normal gray-white differentiation. No mass effect or midline shift is seen.  There is no evidence of fracture; visualized osseous structures are unremarkable in appearance. The orbits are within normal limits. The paranasal sinuses and mastoid air cells are well-aerated. No significant soft tissue abnormalities are seen.  IMPRESSION: 1. No acute intracranial pathology seen on CT. 2. Mild cortical volume loss and scattered small vessel ischemic microangiopathy.  These results were called by telephone at the time of interpretation on 07/11/2014 at 3:18 am to Dr. Shanon Rosser, who verbally acknowledged these results.   Electronically Signed   By: Garald Balding M.D.   On: 07/11/2014 03:19    ECG:  HR 34, marked sinus brady, significant ST elevation in inferior leads, ST depression 1, avl  Impression: Suspect inferior STEMI  Recommendations: Plan emergent LHC possible PCI Case discussed with Dr. Ellyn Hack Further case in Cardic ICU post procedure and pending results of Cath. No absolute contraindications to DAPT  Thank you for allowing [place] to participate in the care of your patient.

## 2014-07-12 NOTE — Progress Notes (Signed)
[facility] hospitalist progress note. Chief complaint. Chest pain. History of present illness. This 80 year old female admitted with left arm weakness and numbness to rule out stroke. CT scan and did not find any acute findings an MRI is pending. Patient complained acutely of chest pain and rapid response was notified and came to the bedside. An EKG was obtained and this suggestive of an acute MI with inferior injury. I also came to the bedside to assess the patient and a code STEMI has been called. The patient complains of central chest pain 10/10 with some radiation to the upper arms bilaterally. Sinuses and her bradycardia and was administered atropine by rapid response.  Post atropine the patient has had no further bradycardia. On-call cardiologist an interventional cardiologist were notified. Doctor Herbie BaltimoreHarding interventional cardiologist is now at the bedside and the plan will be for a cardiac cath. Initially blood pressure somewhat soft and the patient was given a fluid bolus. Vital signs. Temperature 97.4, pulse 73, respiration 34, blood pressure initially somewhat soft but latest greater than 110/60. General appearance. Frail-appearing elderly female who is alert and in no obvious distress. She continues to complain of chest pain. Cardiac. Rate and rhythm regular. She does have peripheral edema. Lungs. Breath sounds are clear. Somewhat reduced in the bases. Abdomen. Soft with positive bowel sounds. Neurologic. Cranial nerves 2-12 grossly intact. Strengths appear equal and 5/5 bilaterally in the upper remedies. Impression/plan. Problem #1. Acute MI. Probable inferior injury. Interventional cardiologist at the bedside and the plan will be for a cardiac cath.

## 2014-07-12 NOTE — Progress Notes (Signed)
Stroke Team Progress Note   Admit Date: 07/11/2014  LOS: 1 day   HISTORY Alexandria Castillo is an 80 y.o. female history of hyperlipidemia, pulmonary embolus and hiatal hernia, presenting with new onset of tingling and numbness involving left upper extremity. Onset was at 10:30 PM on 07/10/2014. She has no previous history of stroke nor TIA. She's been taking aspirin 325 mg per day. CT scan of her head showed no acute intracranial abnormality. NIH stroke score was 2.  LSN: 10:30 PM on 07/10/2014  tPA Given: No: Mild deficits which were improving in the emergency room.  MRankin: 1  SUBJECTIVE  no family is at the bedside.  Overnight, she had chest pain 10/10 and EKG showed STEMI. She had cardiac cath showing 99% blockage at RCA for which she received stent. This morning, she stated that her chest pain is much better and her weakness and numbness of left arm also improved some. She is on ASA and ticargrelor for cardiac prevention. Complain of back pain and received morphine in am.  OBJECTIVE Most recent Vital Signs: Filed Vitals:   07/12/14 1130 07/12/14 1145 07/12/14 1200 07/12/14 1215  BP: 115/55 118/54 133/51 125/61  Pulse: 67 57 54 59  Temp:   98.6 F (37 C)   TempSrc:   Oral   Resp: 24 29 16 9   Height:      Weight:      SpO2: 100% 100% 100% 100%   CBG (last 3)  No results found for this basename: GLUCAP,  in the last 72 hours   IV Fluid Intake:     Medications: Scheduled:  . aspirin  81 mg Oral Daily  . atorvastatin  10 mg Oral q1800  . [START ON 07/13/2014] enoxaparin (LOVENOX) injection  40 mg Subcutaneous Q24H  . metoprolol tartrate  12.5 mg Oral BID  . sodium chloride  3 mL Intravenous Q12H  . sodium chloride  3 mL Intravenous Q12H  . ticagrelor  90 mg Oral BID   Continuous Infusions:   PRN: @MEDSPRNSH @  Diet:  Clear Liquid  Activity:  Bedrest DVT Prophylaxis:  SCDs  CLINICALLY SIGNIFICANT STUDIES Basic Metabolic Panel:  Recent Labs Lab 07/11/14 0344  07/11/14 0925 07/12/14 0825  NA 140  --   --   K 3.7  --   --   CL 102  --   --   CO2 27  --   --   GLUCOSE 108*  --   --   BUN 18  --   --   CREATININE 0.82 0.67 0.62  CALCIUM 9.7  --   --    Liver Function Tests:  Recent Labs Lab 07/11/14 0344  AST 12  ALT 7  ALKPHOS 74  BILITOT 0.4  PROT 6.5  ALBUMIN 3.7   CBC:  Recent Labs Lab 07/11/14 0344 07/11/14 0925 07/12/14 0825  WBC 9.5 7.7 9.8  NEUTROABS 6.9  --   --   HGB 13.6 13.3 13.2  HCT 40.1 39.5 38.4  MCV 88.3 88.0 86.9  PLT 191 188 181   Coagulation:  Recent Labs Lab 07/11/14 0344  LABPROT 12.4  INR 0.92   Cardiac Enzymes:  Recent Labs Lab 07/11/14 0400 07/12/14 0402 07/12/14 1030  TROPONINI <0.30 >20.00* >20.00*   Urinalysis: No results found for this basename: COLORURINE, APPERANCEUR, LABSPEC, PHURINE, GLUCOSEU, HGBUR, BILIRUBINUR, KETONESUR, PROTEINUR, UROBILINOGEN, NITRITE, LEUKOCYTESUR,  in the last 168 hours Lipid Panel    Component Value Date/Time   CHOL 217* 07/11/2014 0925  TRIG 79 07/11/2014 0925   HDL 50 07/11/2014 0925   CHOLHDL 4.3 07/11/2014 0925   VLDL 16 07/11/2014 0925   LDLCALC 151* 07/11/2014 0925   HgbA1C .resu Lab Results  Component Value Date   HGBA1C 5.6 07/11/2014   TSH  Lab Results  Component Value Date   TSH 3.220 07/12/2014   Urine Drug Screen:   No results found for this basename: labopia, cocainscrnur, labbenz, amphetmu, thcu, labbarb    Alcohol Level: No results found for this basename: ETH,  in the last 168 hours  Dg Chest 2 View  07/11/2014   CLINICAL DATA:  Stroke.  EXAM: CHEST  2 VIEW  COMPARISON:  Chest x-ray 10/23/2012.  FINDINGS: Lung volumes are normal. No consolidative airspace disease. No pleural effusions. No pneumothorax. No evidence of pulmonary edema. Mild cardiomegaly. Upper mediastinal contours are within normal limits. Atherosclerosis in the thoracic aorta.  IMPRESSION: 1. No radiographic evidence of acute cardiopulmonary disease. 2. Mild  cardiomegaly. 3. Atherosclerosis.   Electronically Signed   By: Trudie Reed M.D.   On: 07/11/2014 10:42   Ct Head Wo Contrast  07/11/2014   CLINICAL DATA:  Code stroke. Left arm and elbow numbness and pain. Weakness.  EXAM: CT HEAD WITHOUT CONTRAST  TECHNIQUE: Contiguous axial images were obtained from the base of the skull through the vertex without intravenous contrast.  COMPARISON:  None.  FINDINGS: There is no evidence of acute infarction, mass lesion, or intra- or extra-axial hemorrhage on CT.  Prominence of the ventricles and sulci reflects mild cortical volume loss. Mild cerebellar atrophy is noted. Mild periventricular white matter change likely reflects small vessel ischemic microangiopathy.  The brainstem and fourth ventricle are within normal limits. The basal ganglia are unremarkable in appearance. The cerebral hemispheres demonstrate grossly normal gray-white differentiation. No mass effect or midline shift is seen.  There is no evidence of fracture; visualized osseous structures are unremarkable in appearance. The orbits are within normal limits. The paranasal sinuses and mastoid air cells are well-aerated. No significant soft tissue abnormalities are seen.  IMPRESSION: 1. No acute intracranial pathology seen on CT. 2. Mild cortical volume loss and scattered small vessel ischemic microangiopathy.  These results were called by telephone at the time of interpretation on 07/11/2014 at 3:18 am to Dr. Paula Libra, who verbally acknowledged these results.   Electronically Signed   By: Roanna Raider M.D.   On: 07/11/2014 03:19    MRI of the brain pending  MRA of the brain pending  Carotid Doppler  1-39% ICA stenosis. Vertebral artery flow is antegrade.  2D Echocardiogram  pending  CXR  1. No radiographic evidence of acute cardiopulmonary disease.  2. Mild cardiomegaly.  3. Atherosclerosis.  EKG 7/26 3:37am - STEMI at inferior leads   PT/OT Recommendations pending  Physical  Exam Temp:  [97.3 F (36.3 C)-98.6 F (37 C)] 98.6 F (37 C) (07/26 1200) Pulse Rate:  [52-93] 59 (07/26 1215) Resp:  [9-34] 9 (07/26 1215) BP: (88-149)/(43-103) 125/61 mmHg (07/26 1215) SpO2:  [90 %-100 %] 100 % (07/26 1215) Arterial Line BP: (135-172)/(57-82) 135/58 mmHg (07/26 1045) Weight:  [200 lb 6.4 oz (90.9 kg)] 200 lb 6.4 oz (90.9 kg) (07/26 0423)  General - Well nourished, well developed, sleepy after morphine for back pain.  Ophthalmologic - not able to see through.  Cardiovascular - Regular rate and rhythm with no murmur.  Lung - clear to auscultation bilaterally   Mental Status -  Level of arousal was sleepy due  to medication and orientation to time, place, and person was intact. Language including expression, naming, repetition, comprehension was assessed and found intact.  Cranial Nerves II - XII - II - Vision intact OU. Visual fields intact . III, IV, VI - PERRL, extraocular movements intact. V - Facial sensation intact bilaterally. VII - Facial movement intact bilaterally. VIII - Hearing & vestibular intact bilaterally. X - Palate elevates symmetrically. XI - Chin turning & shoulder shrug intact bilaterally. XII - Tongue protrusion intact.  Motor Strength - left UE initially drift but after encourage no drift, distal UE muscle strength 5/5. RUE 5/5, BLE 4/5 symmetrical Bulk was normal and fasciculations were absent.    Motor Tone - Muscle tone was assessed at the neck and appendages and was normal. Reflexes - The patient's reflexes were normal in all extremities and she had no pathological reflexes.  Sensory - Light touch, temperature/pinprick were assessed and were normal.    Coordination - The patient had normal movements in the hands and feet with no ataxia or dysmetria.  Tremor was absent.  Gait and Station - not tested.  ASSESSMENT Ms. Alexandria Castillo is a 80 y.o. female presenting with left UE numbness tingling. Hx of HLD and PE. NIHSS = 2. CT no  acute abnormality. Overnight, pt had STEMI and underwent cardiac cath with stenting. This morning drowsy after medication. Exam non focal. It is likely that her left UE numbness tingling is associated with her MI instead of stroke. Will continue her stroke work up though with MRI, 2D echo. Will also do venous doppler since she has hx of PE.    MI  LDL 151  A1C 5.6  Hx of PE  TREATMENT/PLAN  Continue ASA for stroke prevention. Cardiology added ticargrelor for STEMI.   Continue statin for stroke prevention  Follow up with MRI brain, MRA head, 2D echo and venous doppler.  GI and DVT prophylaxis  Avoid hypotension  Stroke risk factor modification  Neurology will follow   SIGNED Marvel Plan, MD PhD 07/12/2014 1:48 PM   To contact Stroke Continuity provider, please refer to [email or web address]. After hours, contact General Neurology

## 2014-07-12 NOTE — Progress Notes (Signed)
Echo Lab  2D Echocardiogram completed.  Rayshard Schirtzinger L Masen Luallen, RDCS 07/12/2014 3:23 PM

## 2014-07-12 NOTE — Evaluation (Signed)
SLP Cancellation Note  Patient Details Name: Durenda HurtMary B Frech MRN: [id number] DOB: 11/10/1929   Cancelled treatment:       Reason Eval/Treat Not Completed:  (order for speech and language evaluation cancelled, please reorder if indicated)   Mills KollerKimball, Aliveah Gallant Ann Brittni Hult, MS [facility]CCC SLP [phone]

## 2014-07-12 NOTE — Interval H&P Note (Signed)
History and Physical Interval Note:  07/12/2014 5:58 AM  Alexandria Castillo  has presented today for surgery, with the diagnosis of STEMI  The various methods of treatment have been discussed with the patient and family. After consideration of risks, benefits and other options for treatment, the patient has consented to  Procedure(s): LEFT HEART CATHETERIZATION WITH CORONARY ANGIOGRAM (N/A) as a surgical intervention .  The patient's history has been reviewed, patient examined, no change in status, stable for surgery.  I have reviewed the patient's chart and labs.  Questions were answered to the patient's satisfaction.     Garris Melhorn W

## 2014-07-13 DIAGNOSIS — I6350 Cerebral infarction due to unspecified occlusion or stenosis of unspecified cerebral artery: Secondary | ICD-10-CM

## 2014-07-13 DIAGNOSIS — I2119 ST elevation (STEMI) myocardial infarction involving other coronary artery of inferior wall: Secondary | ICD-10-CM

## 2014-07-13 LAB — BASIC METABOLIC PANEL
Anion gap: 11 (ref 5–15)
BUN: 16 mg/dL (ref 6–23)
CHLORIDE: 107 meq/L (ref 96–112)
CO2: 24 mEq/L (ref 19–32)
Calcium: 9 mg/dL (ref 8.4–10.5)
Creatinine, Ser: 0.84 mg/dL (ref 0.50–1.10)
GFR calc Af Amer: 73 mL/min — ABNORMAL LOW (ref >90)
GFR calc non Af Amer: 63 mL/min — ABNORMAL LOW (ref >90)
GLUCOSE: 91 mg/dL (ref 70–99)
Potassium: 3.4 mEq/L — ABNORMAL LOW (ref 3.7–5.3)
Sodium: 142 mEq/L (ref 137–147)

## 2014-07-13 LAB — CBC
HEMATOCRIT: 38.3 % (ref 36.0–46.0)
HEMOGLOBIN: 12.6 g/dL (ref 12.0–15.0)
MCH: 29.2 pg (ref 26.0–34.0)
MCHC: 32.9 g/dL (ref 30.0–36.0)
MCV: 88.7 fL (ref 78.0–100.0)
Platelets: 163 10*3/uL (ref 150–400)
RBC: 4.32 MIL/uL (ref 3.87–5.11)
RDW: 16.6 % — ABNORMAL HIGH (ref 11.5–15.5)
WBC: 8.8 10*3/uL (ref 4.0–10.5)

## 2014-07-13 LAB — POCT ACTIVATED CLOTTING TIME: Activated Clotting Time: 456 seconds

## 2014-07-13 MED ORDER — STROKE: EARLY STAGES OF RECOVERY BOOK
Freq: Once | Status: DC
  Filled 2014-07-13: qty 1

## 2014-07-13 MED ORDER — RAMIPRIL 2.5 MG PO CAPS
2.5000 mg | ORAL_CAPSULE | Freq: Every day | ORAL | Status: DC
  Administered 2014-07-13 – 2014-07-14 (×2): 2.5 mg via ORAL
  Filled 2014-07-13 (×3): qty 1

## 2014-07-13 MED ORDER — POTASSIUM CHLORIDE CRYS ER 20 MEQ PO TBCR
20.0000 meq | EXTENDED_RELEASE_TABLET | Freq: Once | ORAL | Status: AC
  Administered 2014-07-13: 20 meq via ORAL
  Filled 2014-07-13: qty 1

## 2014-07-13 MED FILL — Sodium Chloride IV Soln 0.9%: INTRAVENOUS | Qty: 50 | Status: AC

## 2014-07-13 NOTE — Care Management Note (Addendum)
    Page 1 of 2   07/14/2014     4:18:05 PM CARE MANAGEMENT NOTE 07/14/2014  Patient:  Alexandria Castillo,Alexandria Castillo   Account Number:  [id number]  Date Initiated:  07/13/2014  Documentation initiated by:  Junius CreamerWELL,DEBBIE  Subjective/Objective Assessment:   adm w mi     Action/Plan:   lives w husband,pcpd r paul eason   Anticipated DC Date:  07/14/2014   Anticipated DC Plan:  HOME W HOME HEALTH SERVICES      DC Planning Services  CM consult  Medication Assistance      [facility]   Choice offered to / List presented to:  C-1 Patient        HH arranged  HH-1 RN  HH-2 PT  HH-3 OT      [facility]   Status of service:  Completed, signed off Medicare Important Message given?  YES (If response is "NO", the following Medicare IM given date fields will be blank) Date Medicare IM given:  07/14/2014 Medicare IM given by:  Pasty Manninen Date Additional Medicare IM given:   Additional Medicare IM given by:    Discharge Disposition:  HOME W HOME HEALTH SERVICES  Per UR Regulation:  Reviewed for med. necessity/level of care/duration of stay  If discussed at Long Length of Stay Meetings, dates discussed:    Comments:  07/13/14 Alexandria AceJulie Jaidin Richison, RN, BSN [phone] Pt for dc home with husband and family to assist.  Pt will need HH follow up; [facility] per pt choice/insurance contracts.  Faxed referral to [phone].  Start of care 24-48h post dc date.  No DME needs, per pt /family.  Pt cannot find 30 day Brilinta card previously given.  Gave another trial card to pt.  7/27 1030a debbie dowell rn,bsn gave pt 30day free brilinta card.

## 2014-07-13 NOTE — Evaluation (Signed)
Physical Therapy Evaluation Patient Details Name: Alexandria Castillo MRN: [id number] DOB: 1929/01/18 Today's Date: 07/13/2014   History of Present Illness  Pt is an 80 y/o female admitted s/p STEMI and L heart cath with coronary angiogram.   Clinical Impression  Pt admitted with the above. Pt currently with functional limitations due to the deficits listed below (see PT Problem List). At the time of PT eval pt was able to perform transfers bed>chair with min assist. Pt states she has declined "a little bit" functionally, however is not willing to commit to SNF at this time for STR. Pt will benefit from skilled PT to increase their independence and safety with mobility to allow discharge to the venue listed below.     Follow Up Recommendations Home health PT;Supervision for mobility/OOB    Equipment Recommendations  None recommended by PT    Recommendations for Other Services       Precautions / Restrictions Precautions Precautions: Fall Restrictions Weight Bearing Restrictions: No      Mobility  Bed Mobility Overal bed mobility: Needs Assistance Bed Mobility: Supine to Sit     Supine to sit: Mod assist     General bed mobility comments: Increased time to achieve EOB. Pt was cued for sequencing and technique. Assist for elevation of trunk, as well as to scoot out to EOB. Bed pad used for increased assist with scooting.   Transfers Overall transfer level: Needs assistance Equipment used: 1 person hand held assist Transfers: Sit to/from [employer] Sit to Stand: Min assist Stand pivot transfers: Min assist       General transfer comment: VC's for sequencing and safety awareness. Pt was able to SPT to wheelchair with assist for balance, and cueing for hand placement and safety awareness. Feel pt would do better with the RW.  Ambulation/Gait                Stairs            Wheelchair Mobility    Modified Rankin (Stroke Patients Only)        Balance Overall balance assessment: Needs assistance Sitting-balance support: Feet supported;No upper extremity supported Sitting balance-Leahy Scale: Fair     Standing balance support: Bilateral upper extremity supported;During functional activity Standing balance-Leahy Scale: Poor Standing balance comment: Feel pt requires use of UE's or outside support to maintain balance in standing.                              Pertinent Vitals/Pain Vitals stable throughout session. Pt on RA throughout transfer and sats decreased to low 80's.     Home Living Family/patient expects to be discharged to:: Private residence Living Arrangements: Spouse/significant other Available Help at Discharge: Family;Available 24 hours/day Type of Home: House Home Access: Ramped entrance     Home Layout: One level Home Equipment: Walker - 4 wheels;Bedside commode;Wheelchair - power;Tub bench      Prior Function Level of Independence: Needs assistance   Gait / Transfers Assistance Needed: Using a rollator for short distances. Able to ambulate around the house and out onto the deck.   ADL's / Homemaking Assistance Needed: States can wash everywhere but her back. Husband assists with that and some dressing.         Hand Dominance   Dominant Hand: Right    Extremity/Trunk Assessment   Upper Extremity Assessment: Defer to OT evaluation  Lower Extremity Assessment: Generalized weakness      Cervical / Trunk Assessment: Kyphotic  Communication   Communication: HOH  Cognition Arousal/Alertness: Awake/alert Behavior During Therapy: WFL for tasks assessed/performed Overall Cognitive Status: Within Functional Limits for tasks assessed                      General Comments      Exercises        Assessment/Plan    PT Assessment Patient needs continued PT services  PT Diagnosis Difficulty walking;Generalized weakness   PT Problem List Decreased  strength;Decreased range of motion;Decreased activity tolerance;Decreased balance;Decreased mobility;Decreased knowledge of use of DME;Decreased safety awareness;Decreased knowledge of precautions  PT Treatment Interventions DME instruction;Gait training;Stair training;Functional mobility training;Therapeutic activities;Therapeutic exercise;Neuromuscular re-education;Patient/family education   PT Goals (Current goals can be found in the Care Plan section) Acute Rehab PT Goals Patient Stated Goal: To return home with husband. PT Goal Formulation: With patient Time For Goal Achievement: 07/20/14 Potential to Achieve Goals: Good    Frequency Min 3X/week   Barriers to discharge        Co-evaluation               End of Session Equipment Utilized During Treatment: Gait belt;Oxygen Activity Tolerance: Patient tolerated treatment well Patient left: with call bell/phone within reach;with family/visitor present;Other (comment) (In wheelchair prepared for transfer off unit. ) Nurse Communication: Mobility status         Time: [id number] PT Time Calculation (min): 26 min   Charges:   PT Evaluation $Initial PT Evaluation Tier I: 1 Procedure PT Treatments $Therapeutic Activity: 8-22 mins   PT G Codes:          Alexandria Castillo, Alexandria Castillo 07/13/2014, 1:17 PM  Alexandria Castillo, PT, DPT Acute Rehabilitation Services Pager: [phone]

## 2014-07-13 NOTE — Progress Notes (Signed)
Stroke Team Progress Note   Admit Date: 07/11/2014  LOS: 2 days   HISTORY Alexandria Castillo is an 80 y.o. female history of hyperlipidemia, pulmonary embolus and hiatal hernia, presenting with new onset of tingling and numbness involving left upper extremity. Onset was at 10:30 PM on 07/10/2014. She has no previous history of stroke nor TIA. She's been taking aspirin 325 mg per day. CT scan of her head showed no acute intracranial abnormality. NIH stroke score was 2.  LSN: 10:30 PM on 07/10/2014  tPA Given: No: Mild deficits which were improving in the emergency room.  MRankin: 1  SUBJECTIVE   Husband   is at the bedside.  07/10/14, she had chest pain 10/10 and EKG showed STEMI. She had cardiac cath showing 99% blockage at RCA for which she received stent. Today her weakness and numbness of left arm also improved some. She is on ASA and ticargrelor for cardiac prevention. No complaints OBJECTIVE Most recent Vital Signs: Filed Vitals:   07/13/14 0945 07/13/14 1000 07/13/14 1050 07/13/14 1214  BP: 112/89 129/71 129/71 90/45  Pulse: 81 84  60  Temp:    97.7 F (36.5 C)  TempSrc:      Resp:  22  15  Height:      Weight:      SpO2:  100%  96%   CBG (last 3)  No results found for this basename: GLUCAP,  in the last 72 hours   IV Fluid Intake:     Medications: Scheduled:  . aspirin  81 mg Oral Daily  . atorvastatin  10 mg Oral q1800  . enoxaparin (LOVENOX) injection  40 mg Subcutaneous Q24H  . metoprolol tartrate  12.5 mg Oral BID  . ramipril  2.5 mg Oral Daily  . sodium chloride  3 mL Intravenous Q12H  . sodium chloride  3 mL Intravenous Q12H  . ticagrelor  90 mg Oral BID   Continuous Infusions:   PRN: @MEDSPRNSH @  Diet:  Cardiac  Activity:  Bedrest DVT Prophylaxis:  SCDs  CLINICALLY SIGNIFICANT STUDIES Basic Metabolic Panel:  Recent Labs Lab 07/11/14 0344  07/12/14 0825 07/13/14 0250  NA 140  --   --  142  K 3.7  --   --  3.4*  CL 102  --   --  107  CO2 27  --   --   24  GLUCOSE 108*  --   --  91  BUN 18  --   --  16  CREATININE 0.82  < > 0.62 0.84  CALCIUM 9.7  --   --  9.0  < > = values in this interval not displayed. Liver Function Tests:   Recent Labs Lab 07/11/14 0344  AST 12  ALT 7  ALKPHOS 74  BILITOT 0.4  PROT 6.5  ALBUMIN 3.7   CBC:  Recent Labs Lab 07/11/14 0344  07/12/14 0825 07/13/14 0250  WBC 9.5  < > 9.8 8.8  NEUTROABS 6.9  --   --   --   HGB 13.6  < > 13.2 12.6  HCT 40.1  < > 38.4 38.3  MCV 88.3  < > 86.9 88.7  PLT 191  < > 181 163  < > = values in this interval not displayed. Coagulation:   Recent Labs Lab 07/11/14 0344  LABPROT 12.4  INR 0.92   Cardiac Enzymes:   Recent Labs Lab 07/12/14 0402 07/12/14 1030 07/12/14 1609  TROPONINI >20.00* >20.00* >20.00*   Urinalysis: No results  found for this basename: COLORURINE, APPERANCEUR, LABSPEC, PHURINE, GLUCOSEU, HGBUR, BILIRUBINUR, KETONESUR, PROTEINUR, UROBILINOGEN, NITRITE, LEUKOCYTESUR,  in the last 168 hours Lipid Panel    Component Value Date/Time   CHOL 217* 07/11/2014 0925   TRIG 79 07/11/2014 0925   HDL 50 07/11/2014 0925   CHOLHDL 4.3 07/11/2014 0925   VLDL 16 07/11/2014 0925   LDLCALC 151* 07/11/2014 0925   HgbA1C .resu Lab Results  Component Value Date   HGBA1C 5.6 07/11/2014   TSH  Lab Results  Component Value Date   TSH 3.220 07/12/2014   Urine Drug Screen:   No results found for this basename: labopia,  cocainscrnur,  labbenz,  amphetmu,  thcu,  labbarb    Alcohol Level: No results found for this basename: ETH,  in the last 168 hours  No results found.  MRI of the brain pending  MRA of the brain pending  Carotid Doppler  1-39% ICA stenosis. Vertebral artery flow is antegrade.  2D Echocardiogram  pending  CXR  1. No radiographic evidence of acute cardiopulmonary disease.  2. Mild cardiomegaly.  3. Atherosclerosis.  EKG 7/26 3:37am - STEMI at inferior leads   PT/OT Recommendations pending  Physical Exam Temp:  [97.6 F  (36.4 C)-98.8 F (37.1 C)] 97.7 F (36.5 C) (07/27 1214) Pulse Rate:  [55-84] 60 (07/27 1214) Resp:  [10-26] 15 (07/27 1214) BP: (90-131)/(39-106) 90/45 mmHg (07/27 1214) SpO2:  [96 %-100 %] 96 % (07/27 1214)  General - Well nourished, well developed, sleepy after morphine for back pain.  Ophthalmologic - not able to see through.  Cardiovascular - Regular rate and rhythm with no murmur.  Lung - clear to auscultation bilaterally   Mental Status -  Level of arousal was sleepy due to medication and orientation to time, place, and person was intact. Language including expression, naming, repetition, comprehension was assessed and found intact.  Cranial Nerves II - XII - II - Vision intact OU. Visual fields intact . III, IV, VI - PERRL, extraocular movements intact. V - Facial sensation intact bilaterally. VII - Facial movement intact bilaterally. VIII - Hearing & vestibular intact bilaterally. X - Palate elevates symmetrically. XI - Chin turning & shoulder shrug intact bilaterally. XII - Tongue protrusion intact.  Motor Strength - mild left grip weakness and diminished fine finger movements on left., distal UE muscle strength 5/5. RUE 5/5, BLE 4/5 symmetrical Bulk was normal and fasciculations were absent.    Motor Tone - Muscle tone was assessed at the neck and appendages and was normal. Reflexes - The patient's reflexes were normal in all extremities and she had no pathological reflexes.  Sensory - Light touch, temperature/pinprick were assessed and were normal.    Coordination - The patient had normal movements in the hands and feet with no ataxia or dysmetria.  Tremor was absent.  Gait and Station - not tested.  ASSESSMENT Alexandria Castillo is a 80 y.o. female presenting with left UE numbness tingling. Hx of HLD and PE. NIHSS = 2. CT no acute abnormality. Overnight, pt had STEMI and underwent cardiac cath with stenting. This morning drowsy after medication. Exam non focal.  It is likely that her left UE numbness tingling is associated with her MI instead of stroke. Will continue her stroke work up though with MRI, 2D echo. Will also do venous doppler since she has hx of PE.    MI  LDL 151  A1C 5.6  Hx of PE  TREATMENT/PLAN  Continue ASA for stroke  prevention. Cardiology added ticargrelor for STEMI.   Continue statin for stroke prevention  Follow up with MRI brain, MRA head, 2D echo and venous doppler.  GI and DVT prophylaxis  Avoid hypotension  Stroke risk factor modification  Mobilize out of bed.  PT/Ot consults   SIGNED Delia HeadyPramod Myrla Malanowski, MD  07/13/2014 3:03 PM   To contact Stroke Continuity provider, please refer to [email or web address]. After hours, contact General Neurology

## 2014-07-13 NOTE — Progress Notes (Signed)
Patient ID: Alexandria Castillo, female   DOB: May 18, 1929, 80 y.o.   MRN: [id number]   SUBJECTIVE: No chest pain or dyspnea.  Having pain in knees and hips.   Scheduled Meds: . aspirin  81 mg Oral Daily  . atorvastatin  10 mg Oral q1800  . enoxaparin (LOVENOX) injection  40 mg Subcutaneous Q24H  . metoprolol tartrate  12.5 mg Oral BID  . potassium chloride  20 mEq Oral Once  . ramipril  2.5 mg Oral Daily  . sodium chloride  3 mL Intravenous Q12H  . sodium chloride  3 mL Intravenous Q12H  . ticagrelor  90 mg Oral BID   Continuous Infusions:  PRN Meds:.sodium chloride, acetaminophen, ALPRAZolam, hydrALAZINE, morphine injection, ondansetron (ZOFRAN) IV, sodium chloride, sodium chloride, sucralfate, traMADol    Filed Vitals:   07/13/14 0600 07/13/14 0700 07/13/14 0735 07/13/14 0800  BP: 124/57 127/50  124/52  Pulse: 64 60  60  Temp:   98.4 F (36.9 C)   TempSrc:   Oral   Resp: 17 20  18   Height:      Weight:      SpO2: 100% 100%  99%    Intake/Output Summary (Last 24 hours) at 07/13/14 0921 Last data filed at 07/13/14 0800  Gross per 24 hour  Intake   1003 ml  Output   2210 ml  Net  -1207 ml    LABS: Basic Metabolic Panel:  Recent Labs  40/98/11 0344  07/12/14 0825 07/13/14 0250  NA 140  --   --  142  K 3.7  --   --  3.4*  CL 102  --   --  107  CO2 27  --   --  24  GLUCOSE 108*  --   --  91  BUN 18  --   --  16  CREATININE 0.82  < > 0.62 0.84  CALCIUM 9.7  --   --  9.0  < > = values in this interval not displayed. Liver Function Tests:  Recent Labs  07/11/14 0344  AST 12  ALT 7  ALKPHOS 74  BILITOT 0.4  PROT 6.5  ALBUMIN 3.7   No results found for this basename: LIPASE, AMYLASE,  in the last 72 hours CBC:  Recent Labs  07/11/14 0344  07/12/14 0825 07/13/14 0250  WBC 9.5  < > 9.8 8.8  NEUTROABS 6.9  --   --   --   HGB 13.6  < > 13.2 12.6  HCT 40.1  < > 38.4 38.3  MCV 88.3  < > 86.9 88.7  PLT 191  < > 181 163  < > = values in this interval not  displayed. Cardiac Enzymes:  Recent Labs  07/12/14 0402 07/12/14 1030 07/12/14 1609  TROPONINI >20.00* >20.00* >20.00*   BNP: No components found with this basename: POCBNP,  D-Dimer: No results found for this basename: DDIMER,  in the last 72 hours Hemoglobin A1C:  Recent Labs  07/11/14 0925  HGBA1C 5.6   Fasting Lipid Panel:  Recent Labs  07/11/14 0925  CHOL 217*  HDL 50  LDLCALC 151*  TRIG 79  CHOLHDL 4.3   Thyroid Function Tests:  Recent Labs  07/12/14 0302  TSH 3.220   Anemia Panel:  Recent Labs  07/12/14 0302  VITAMINB12 408  FOLATE 15.0    RADIOLOGY: Dg Chest 2 View  07/11/2014   CLINICAL DATA:  Stroke.  EXAM: CHEST  2 VIEW  COMPARISON:  Chest x-ray 10/23/2012.  FINDINGS: Lung volumes are normal. No consolidative airspace disease. No pleural effusions. No pneumothorax. No evidence of pulmonary edema. Mild cardiomegaly. Upper mediastinal contours are within normal limits. Atherosclerosis in the thoracic aorta.  IMPRESSION: 1. No radiographic evidence of acute cardiopulmonary disease. 2. Mild cardiomegaly. 3. Atherosclerosis.   Electronically Signed   By: Trudie Reedaniel  Entrikin M.D.   On: 07/11/2014 10:42   Ct Head Wo Contrast  07/11/2014   CLINICAL DATA:  Code stroke. Left arm and elbow numbness and pain. Weakness.  EXAM: CT HEAD WITHOUT CONTRAST  TECHNIQUE: Contiguous axial images were obtained from the base of the skull through the vertex without intravenous contrast.  COMPARISON:  None.  FINDINGS: There is no evidence of acute infarction, mass lesion, or intra- or extra-axial hemorrhage on CT.  Prominence of the ventricles and sulci reflects mild cortical volume loss. Mild cerebellar atrophy is noted. Mild periventricular white matter change likely reflects small vessel ischemic microangiopathy.  The brainstem and fourth ventricle are within normal limits. The basal ganglia are unremarkable in appearance. The cerebral hemispheres demonstrate grossly normal  gray-white differentiation. No mass effect or midline shift is seen.  There is no evidence of fracture; visualized osseous structures are unremarkable in appearance. The orbits are within normal limits. The paranasal sinuses and mastoid air cells are well-aerated. No significant soft tissue abnormalities are seen.  IMPRESSION: 1. No acute intracranial pathology seen on CT. 2. Mild cortical volume loss and scattered small vessel ischemic microangiopathy.  These results were called by telephone at the time of interpretation on 07/11/2014 at 3:18 am to Dr. Paula LibraJOHN MOLPUS, who verbally acknowledged these results.   Electronically Signed   By: Roanna RaiderJeffery  Chang M.D.   On: 07/11/2014 03:19    PHYSICAL EXAM General: NAD Neck: No JVD, no thyromegaly or thyroid nodule.  Lungs: Crackles at bases bilaterally. . CV: Nondisplaced PMI.  Heart regular S1/S2, no S3/S4, no murmur.  No peripheral edema.   Abdomen: Soft, nontender, no hepatosplenomegaly, no distention.  Neurologic: Alert and oriented x 3.  Psych: Normal affect. Extremities: No clubbing or cyanosis. Right groin cath site benign.   TELEMETRY: Reviewed telemetry pt in NSR, no bradyarrhythmias  ASSESSMENT AND PLAN: 80 yo presented with concern for CVA, then developed acute inferior MI in hospital, now s/p DES to RCA.  1. CAD: Inferior MI, s/p DES to RCA.  She will continue ticagrelor + ASA.  Started on low dose statin given history of intolerance.  Continue metoprolol and will add ramipril.  EF preserved on LV-gram, awaiting report of echo done yesterday.   2. ?CVA: Plan for MRI today.  3. Chronic pain: Per primary team.  4. Would work on getting her out of bed.  Minimal walking at baseline with walker.  5. Could go home today from cardiac standpoint.  Await ongoing CVA workup.  If stays today, can go to floor.   Marca AnconaDalton Tascha Casares 07/13/2014 9:25 AM

## 2014-07-13 NOTE — Evaluation (Signed)
Occupational Therapy Evaluation Patient Details Name: Alexandria Castillo MRN: [id number] DOB: 04/07/1929 Today's Date: 07/13/2014    History of Present Illness Pt is an [age] y/o female admitted with chest pain Lt. UE numbness and weakness.   She was found to have STEMI.  She underwent L heart cath with coronary angiogram.  Stroke work up underway due to presentation of Lt. UE numbness.  PMH includes:  PE; fractured tibia; hip pain   Clinical Impression   Pt admitted with above. She demonstrates the below listed deficits and will benefit from continued OT to maximize safety and independence with BADLs.  Pt presents to OT with generalized weakness.  At baseline, pt mostly performed only bed to w/c transfers and on occasion ambulated short household distances.  She was modified independent - min A with BADLSsPTA.  Currently, she requires max A for LB ADLs, and min - mod A for transfers (mod A as she fatigues).  I spoke with her re: SNF level rehab, but pt's son states that he or his sisters will be available to assist her with all transfers and ADLs. Recommend HHOT, HHPT, and HHaide.      Follow Up Recommendations  Home health OT;Supervision/Assistance - 24 hour;Other (comment) (HHPT, HHaide)    Equipment Recommendations  None recommended by OT    Recommendations for Other Services       Precautions / Restrictions Precautions Precautions: Fall      Mobility Bed Mobility                  Transfers Overall transfer level: Needs assistance Equipment used: 4-wheeled walker;1 person hand held assist Transfers: Sit to/from UGI CorporationStand;Stand Pivot Transfers Sit to Stand: Min guard;Mod assist Stand pivot transfers: Min assist;Mod assist       General transfer comment: Pt initially required min A, but as she fatigued, she required mod A    Balance Overall balance assessment: Needs assistance Sitting-balance support: Feet supported Sitting balance-Leahy Scale: Good     Standing balance  support: Bilateral upper extremity supported Standing balance-Leahy Scale: Poor Standing balance comment: Pt is dependent upon UE support                            ADL Overall ADL's : Needs assistance/impaired Eating/Feeding: Modified independent;Sitting   Grooming: Wash/dry hands;Wash/dry face;Oral care;Brushing hair;Set up;Sitting   Upper Body Bathing: Set up;Sitting   Lower Body Bathing: Maximal assistance;Sit to/from stand   Upper Body Dressing : Minimal assistance;Sitting   Lower Body Dressing: Maximal assistance;Sit to/from stand Lower Body Dressing Details (indicate cue type and reason): Pt is able to don/doff socks, but unable to don pants over feet, nor pull pants over hips - total A for donning pants Toilet Transfer: Moderate assistance;Stand-pivot;Cueing for sequencing;BSC Toilet Transfer Details (indicate cue type and reason): Pt is impulsive with poor planning.  She was incontinent of stool with no awareness.   She fatigued quickly with transfers Toileting- ArchitectClothing Manipulation and Hygiene: Total assistance;Sit to/from stand;Sitting/lateral lean       Functional mobility during ADLs: Moderate assistance;Rolling walker (using rollator or stand pivot transfer) General ADL Comments: Son and husband present.  Pt requires min - mod A for basic transfers and max A for LB ADLs.  Discussed options for post acute rehab.  Family is insistent they can provide necessary level of care  to allow [facility]       Vision  Perception     Praxis      Pertinent Vitals/Pain No complaint of pain      Hand Dominance Right   Extremity/Trunk Assessment Upper Extremity Assessment Upper Extremity Assessment: Overall WFL for tasks assessed (full AROM and equal strength bil. UEs)   Lower Extremity Assessment Lower Extremity Assessment: Defer to PT evaluation   Cervical / Trunk Assessment Cervical / Trunk  Assessment: Kyphotic   Communication Communication Communication: HOH   Cognition Arousal/Alertness: Awake/alert Behavior During Therapy: WFL for tasks assessed/performed Overall Cognitive Status: Within Functional Limits for tasks assessed                     General Comments       Exercises       Shoulder Instructions      Home Living Family/patient expects to be discharged to:: Private residence Living Arrangements: Spouse/significant other Available Help at Discharge: Family;Available 24 hours/day Type of Home: House Home Access: Ramped entrance     Home Layout: One level     Bathroom Shower/Tub: Tub/shower unit;Curtain Shower/tub characteristics: Engineer, building services: Standard     Home Equipment: Environmental consultant - 4 wheels;Bedside commode;Wheelchair - power;Tub bench          Prior Functioning/Environment Level of Independence: Needs assistance  Gait / Transfers Assistance Needed: Using a rollator for short distances. Able to ambulate around the house and out onto the deck.  ADL's / Homemaking Assistance Needed: States can wash everywhere but her back. Husband assists with that and some dressing.    Comments: Pt states she cooks from wheelchair level    OT Diagnosis: Generalized weakness   OT Problem List: Decreased strength;Decreased activity tolerance;Impaired balance (sitting and/or standing);Decreased safety awareness;Decreased knowledge of use of DME or AE;Obesity   OT Treatment/Interventions: Self-care/ADL training;DME and/or AE instruction;Therapeutic activities;Patient/family education;Balance training    OT Goals(Current goals can be found in the care plan section) Acute Rehab OT Goals Patient Stated Goal: To return home with husband. OT Goal Formulation: With patient/family Time For Goal Achievement: 07/20/14 Potential to Achieve Goals: Good ADL Goals Pt Will Perform Lower Body Bathing: with min assist;sit to/from stand Pt Will Perform  Lower Body Dressing: with min assist;sit to/from stand Pt Will Transfer to Toilet: with min assist;stand pivot transfer;ambulating;bedside commode;regular height toilet;grab bars Pt Will Perform Toileting - Clothing Manipulation and hygiene: with min assist;sit to/from stand  OT Frequency: Min 2X/week   Barriers to D/C:            Co-evaluation              End of Session Equipment Utilized During Treatment: Engineer, water Communication: Mobility status  Activity Tolerance: Patient limited by fatigue Patient left: in chair;with call bell/phone within reach;with family/visitor present   Time: 1610-9604 OT Time Calculation (min): 47 min Charges:  OT General Charges $OT Visit: 1 Procedure OT Evaluation $Initial OT Evaluation Tier I: 1 Procedure OT Treatments $Self Care/Home Management : 38-52 mins G-Codes:    Christel Bai M 07/27/2014, 4:23 PM

## 2014-07-14 ENCOUNTER — Inpatient Hospital Stay (HOSPITAL_COMMUNITY): Payer: Medicare PPO

## 2014-07-14 DIAGNOSIS — Z9861 Coronary angioplasty status: Secondary | ICD-10-CM

## 2014-07-14 MED ORDER — TICAGRELOR 90 MG PO TABS
90.0000 mg | ORAL_TABLET | Freq: Two times a day (BID) | ORAL | Status: DC
Start: 2014-07-14 — End: 2014-07-14

## 2014-07-14 MED ORDER — METOPROLOL SUCCINATE ER 25 MG PO TB24: 25.0000 mg | ORAL_TABLET | Freq: Every day | ORAL | Status: DC

## 2014-07-14 MED ORDER — TICAGRELOR 90 MG PO TABS: 90.0000 mg | ORAL_TABLET | Freq: Two times a day (BID) | ORAL | Status: AC

## 2014-07-14 MED ORDER — RAMIPRIL 2.5 MG PO CAPS: 2.5000 mg | ORAL_CAPSULE | Freq: Every day | ORAL | Status: DC

## 2014-07-14 MED ORDER — ATORVASTATIN CALCIUM 10 MG PO TABS: 10.0000 mg | ORAL_TABLET | Freq: Every day | ORAL | Status: AC

## 2014-07-14 MED ORDER — ASPIRIN 81 MG PO TABS: 81.0000 mg | ORAL_TABLET | Freq: Every day | ORAL | Status: AC

## 2014-07-14 NOTE — Progress Notes (Signed)
D/C Tele, D/C IV, D/C instructions reviewed with pt. And her family members, D/C paperwork given to pt. And prescriptions given to pt., pt. And pt.'s family verbalized understanding, pt. Left unit via wheelchair by NT, and transported home via her daughter, no signs or symptoms of distress.

## 2014-07-14 NOTE — Progress Notes (Signed)
Patient ID: Alexandria Castillo, female   DOB: [birth year], 80 y.o.   MRN: [id number]   SUBJECTIVE: No chest pain or dyspnea.  Having pain in knees and hips. Did not sleep well.  Scheduled Meds: .  stroke: mapping our early stages of recovery book   Does not apply Once  . aspirin  81 mg Oral Daily  . atorvastatin  10 mg Oral q1800  . enoxaparin (LOVENOX) injection  40 mg Subcutaneous Q24H  . metoprolol tartrate  12.5 mg Oral BID  . ramipril  2.5 mg Oral Daily  . sodium chloride  3 mL Intravenous Q12H  . sodium chloride  3 mL Intravenous Q12H  . ticagrelor  90 mg Oral BID   Continuous Infusions:  PRN Meds:.sodium chloride, acetaminophen, ALPRAZolam, hydrALAZINE, morphine injection, ondansetron (ZOFRAN) IV, sodium chloride, sodium chloride, sucralfate, traMADol    Filed Vitals:   07/13/14 1214 07/13/14 2130 07/14/14 0203 07/14/14 0623  BP: 90/45 134/55  106/47  Pulse: 60 76  66  Temp: 97.7 F (36.5 C) 97.6 F (36.4 C) 97.8 F (36.6 C) 97.9 F (36.6 C)  TempSrc:  Oral Oral Oral  Resp: 15 18  18   Height:      Weight:      SpO2: 96% 100%  95%    Intake/Output Summary (Last 24 hours) at 07/14/14 0937 Last data filed at 07/13/14 1900  Gross per 24 hour  Intake      6 ml  Output    152 ml  Net   -146 ml    LABS: Basic Metabolic Panel:  Recent Labs  78/29/56 0825 07/13/14 0250  NA  --  142  K  --  3.4*  CL  --  107  CO2  --  24  GLUCOSE  --  91  BUN  --  16  CREATININE 0.62 0.84  CALCIUM  --  9.0   Liver Function Tests: No results found for this basename: AST, ALT, ALKPHOS, BILITOT, PROT, ALBUMIN,  in the last 72 hours No results found for this basename: LIPASE, AMYLASE,  in the last 72 hours CBC:  Recent Labs  07/12/14 0825 07/13/14 0250  WBC 9.8 8.8  HGB 13.2 12.6  HCT 38.4 38.3  MCV 86.9 88.7  PLT 181 163   Cardiac Enzymes:  Recent Labs  07/12/14 0402 07/12/14 1030 07/12/14 1609  TROPONINI >20.00* >20.00* >20.00*   BNP: No components found with  this basename: POCBNP,  D-Dimer: No results found for this basename: DDIMER,  in the last 72 hours Hemoglobin A1C: No results found for this basename: HGBA1C,  in the last 72 hours Fasting Lipid Panel: No results found for this basename: CHOL, HDL, LDLCALC, TRIG, CHOLHDL, LDLDIRECT,  in the last 72 hours Thyroid Function Tests:  Recent Labs  07/12/14 0302  TSH 3.220   Anemia Panel:  Recent Labs  07/12/14 0302  VITAMINB12 408  FOLATE 15.0    RADIOLOGY: Dg Chest 2 View  07/11/2014   CLINICAL DATA:  Stroke.  EXAM: CHEST  2 VIEW  COMPARISON:  Chest x-ray 10/23/2012.  FINDINGS: Lung volumes are normal. No consolidative airspace disease. No pleural effusions. No pneumothorax. No evidence of pulmonary edema. Mild cardiomegaly. Upper mediastinal contours are within normal limits. Atherosclerosis in the thoracic aorta.  IMPRESSION: 1. No radiographic evidence of acute cardiopulmonary disease. 2. Mild cardiomegaly. 3. Atherosclerosis.   Electronically Signed   By: Trudie Reed M.D.   On: 07/11/2014 10:42   Ct Head Wo Contrast  07/11/2014   CLINICAL DATA:  Code stroke. Left arm and elbow numbness and pain. Weakness.  EXAM: CT HEAD WITHOUT CONTRAST  TECHNIQUE: Contiguous axial images were obtained from the base of the skull through the vertex without intravenous contrast.  COMPARISON:  None.  FINDINGS: There is no evidence of acute infarction, mass lesion, or intra- or extra-axial hemorrhage on CT.  Prominence of the ventricles and sulci reflects mild cortical volume loss. Mild cerebellar atrophy is noted. Mild periventricular white matter change likely reflects small vessel ischemic microangiopathy.  The brainstem and fourth ventricle are within normal limits. The basal ganglia are unremarkable in appearance. The cerebral hemispheres demonstrate grossly normal gray-white differentiation. No mass effect or midline shift is seen.  There is no evidence of fracture; visualized osseous structures are  unremarkable in appearance. The orbits are within normal limits. The paranasal sinuses and mastoid air cells are well-aerated. No significant soft tissue abnormalities are seen.  IMPRESSION: 1. No acute intracranial pathology seen on CT. 2. Mild cortical volume loss and scattered small vessel ischemic microangiopathy.  These results were called by telephone at the time of interpretation on 07/11/2014 at 3:18 am to Dr. Paula LibraJOHN MOLPUS, who verbally acknowledged these results.   Electronically Signed   By: Roanna RaiderJeffery  Chang M.D.   On: 07/11/2014 03:19    PHYSICAL EXAM General: NAD Neck: No JVD,  Lungs: Clear bilaterally. . CV: Nondisplaced PMI.  Heart regular S1/S2, no S3/S4, no murmur.  No peripheral edema.   Abdomen: Soft, nontender, no hepatosplenomegaly, no distention.  Neurologic: Alert and oriented x 3.  Psych: Normal affect. Extremities: No cyanosis.  TELEMETRY: Reviewed telemetry pt in NSR, no bradyarrhythmias  ASSESSMENT AND PLAN: 80 yo presented with concern for CVA, then developed acute inferior MI in hospital, now s/p DES to RCA.  1. CAD: Inferior MI, s/p DES to RCA.  She will continue ticagrelor + ASA.  Started on low dose statin given history of intolerance.  Continue metoprolol and will add ramipril.  EF preserved on LV-gram, awaiting report of echo done yesterday.   2. ?CVA: MRI today.  3. Chronic pain: Per primary team.  4. Would work on getting her out of bed.  Minimal walking at baseline with walker. PT eval done- rec HHPT 5. Could go home today from cardiac standpoint.  Ongoing CVA workup.  Will send home if ok from Neuro standpoint.  Await neuro recs and MRI result.  Martin Belling S. 07/14/2014 9:37 AM

## 2014-07-14 NOTE — Discharge Instructions (Signed)
[facility] - Cardiology Dr. Prentice DockerSuresh Koneswaran August 13 th at 4:00 pm [address] [place], [place] [address] [phone]  Dr Roda ShuttersXu  Vascular Neurology clinic [facility] Call [phone] to make appointment in 4 weeks

## 2014-07-14 NOTE — Progress Notes (Signed)
CARDIAC REHAB PHASE I   PRE:  Rate/Rhythm: 74 SR  BP:  Supine:   Sitting: 114/60  Standing:    SaO2: 93 RA  MODE:  Ambulation: to sink and recliner   POST:  Rate/Rhythm: 103  BP:  Supine:   Sitting: 130/70  Standing:    SaO2: 95 RA 1120-1220 Assisted X 2 used walker and gait belt to ambulate. Pt leans froward and has a hard time straightening her back. She only does limited walking in the house,mostly uses her motorized w/c. Pt was able to walk to the sink in her room and then to recliner. She c/o of weakness in her left hand. No c/o of cp or SOB with walking. Pt to recliner after wlak with call light in reach. Completed MI and stent education with pt. Gave her MI booklet, stent card and heart healthy diet. Encouraged her to stay as active as she can. Instructed her how to use sl NTG, signs of CVA and to call 911. She has some difficulty with recall. Pt is not able to physically do Outpt. CRP.  Melina CopaLisa Katya Rolston RN 07/14/2014 12:17 PM

## 2014-07-14 NOTE — Progress Notes (Signed)
Stroke Team Progress Note   Admit Date: 07/11/2014  LOS: 3 days   HISTORY Durenda HurtMary B Hoselton is an 80 y.o. female history of hyperlipidemia, pulmonary embolus and hiatal hernia, presenting with new onset of tingling and numbness involving left upper extremity. Onset was at 10:30 PM on 07/10/2014. She has no previous history of stroke nor TIA. She's been taking aspirin 325 mg per day. CT scan of her head showed no acute intracranial abnormality. NIH stroke score was 2.  LSN: 10:30 PM on 07/10/2014  tPA Given: No: Mild deficits which were improving in the emergency room.  MRankin: 1  SUBJECTIVE   She is doing well and wants to go home. She is on ASA and ticagrelor for cardiac prevention. No complaints OBJECTIVE Most recent Vital Signs: Filed Vitals:   07/14/14 0203 07/14/14 0623 07/14/14 1146 07/14/14 1147  BP:  106/47 114/60   Pulse:  66  74  Temp: 97.8 F (36.6 C) 97.9 F (36.6 C)    TempSrc: Oral Oral    Resp:  18    Height:      Weight:      SpO2:  95%     CBG (last 3)  No results found for this basename: GLUCAP,  in the last 72 hours   IV Fluid Intake:     Medications: Scheduled:  .  stroke: mapping our early stages of recovery book   Does not apply Once  . aspirin  81 mg Oral Daily  . atorvastatin  10 mg Oral q1800  . enoxaparin (LOVENOX) injection  40 mg Subcutaneous Q24H  . metoprolol tartrate  12.5 mg Oral BID  . ramipril  2.5 mg Oral Daily  . sodium chloride  3 mL Intravenous Q12H  . sodium chloride  3 mL Intravenous Q12H  . ticagrelor  90 mg Oral BID    :      Diet:  Cardiac  Activity:  Bedrest DVT Prophylaxis:  SCDs  CLINICALLY SIGNIFICANT STUDIES Basic Metabolic Panel:  Recent Labs Lab 07/11/14 0344  07/12/14 0825 07/13/14 0250  NA 140  --   --  142  K 3.7  --   --  3.4*  CL 102  --   --  107  CO2 27  --   --  24  GLUCOSE 108*  --   --  91  BUN 18  --   --  16  CREATININE 0.82  < > 0.62 0.84  CALCIUM 9.7  --   --  9.0  < > = values in this  interval not displayed. Liver Function Tests:   Recent Labs Lab 07/11/14 0344  AST 12  ALT 7  ALKPHOS 74  BILITOT 0.4  PROT 6.5  ALBUMIN 3.7   CBC:  Recent Labs Lab 07/11/14 0344  07/12/14 0825 07/13/14 0250  WBC 9.5  < > 9.8 8.8  NEUTROABS 6.9  --   --   --   HGB 13.6  < > 13.2 12.6  HCT 40.1  < > 38.4 38.3  MCV 88.3  < > 86.9 88.7  PLT 191  < > 181 163  < > = values in this interval not displayed. Coagulation:   Recent Labs Lab 07/11/14 0344  LABPROT 12.4  INR 0.92   Cardiac Enzymes:   Recent Labs Lab 07/12/14 0402 07/12/14 1030 07/12/14 1609  TROPONINI >20.00* >20.00* >20.00*   Urinalysis: No results found for this basename: COLORURINE, APPERANCEUR, LABSPEC, PHURINE, GLUCOSEU, HGBUR, BILIRUBINUR, KETONESUR, PROTEINUR, UROBILINOGEN,  NITRITE, LEUKOCYTESUR,  in the last 168 hours Lipid Panel    Component Value Date/Time   CHOL 217* 07/11/2014 0925   TRIG 79 07/11/2014 0925   HDL 50 07/11/2014 0925   CHOLHDL 4.3 07/11/2014 0925   VLDL 16 07/11/2014 0925   LDLCALC 151* 07/11/2014 0925   HgbA1C .resu Lab Results  Component Value Date   HGBA1C 5.6 07/11/2014   TSH  Lab Results  Component Value Date   TSH 3.220 07/12/2014   Urine Drug Screen:   No results found for this basename: labopia,  cocainscrnur,  labbenz,  amphetmu,  thcu,  labbarb    Alcohol Level: No results found for this basename: ETH,  in the last 168 hours  Mr Brain Wo Contrast  07/14/2014   CLINICAL DATA:  Stroke. New onset left upper extremity numbness and tingling.  EXAM: MRI HEAD WITHOUT CONTRAST  MRA HEAD WITHOUT CONTRAST  TECHNIQUE: Multiplanar, multiecho pulse sequences of the brain and surrounding structures were obtained without intravenous contrast. Angiographic images of the head were obtained using MRA technique without contrast.  COMPARISON:  Head CT 07/11/2014  FINDINGS: MRI HEAD FINDINGS  There are multiple punctate foci cortical restricted diffusion in the right frontal and  parietal lobes, predominantly involving the precentral and postcentral gyri, with some additional small foci noted more anteriorly in the right frontal lobe as well as more inferiorly in the right parietal and posterior right temporal lobes. A punctate focus of susceptibility artifact in the region of the left external capsule is compatible with remote microhemorrhage. There is moderate cerebral atrophy. There is no mass, midline shift, or extra-axial fluid collection.  Orbits are unremarkable. Frontal sinuses are hypoplastic. There is minimal bilateral anterior ethmoid air cell and maxillary sinus mucosal thickening. Mastoid air cells are clear. Major intracranial vascular flow voids are preserved. There is grade 1 anterolisthesis of C3 on C4.  MRA HEAD FINDINGS  Visualized distal vertebral arteries are patent and codominant. PICA, AICA, and SCA origins are patent. Basilar artery is patent without stenosis. PCAs are unremarkable aside from mild distal branch vessel irregularity and attenuation.  Internal carotid arteries are patent from skullbase to carotid termini. ACAs are unremarkable. MCAs are unremarkable aside from minimal distal branch vessel irregularity. Anterior communicating artery is patent. No intracranial aneurysm is identified.  IMPRESSION: 1. Multiple punctate cortical infarcts in the right MCA territory, predominantly in the right frontal and parietal lobes. 2. No evidence of major intracranial arterial occlusion or significant proximal stenosis. Very mild distal branch vessel irregularity may reflect mild atherosclerotic change.   Electronically Signed   By: Sebastian Ache   On: 07/14/2014 09:48   Mr Maxine Glenn Head/brain Wo Cm  07/14/2014   CLINICAL DATA:  Stroke. New onset left upper extremity numbness and tingling.  EXAM: MRI HEAD WITHOUT CONTRAST  MRA HEAD WITHOUT CONTRAST  TECHNIQUE: Multiplanar, multiecho pulse sequences of the brain and surrounding structures were obtained without intravenous  contrast. Angiographic images of the head were obtained using MRA technique without contrast.  COMPARISON:  Head CT 07/11/2014  FINDINGS: MRI HEAD FINDINGS  There are multiple punctate foci cortical restricted diffusion in the right frontal and parietal lobes, predominantly involving the precentral and postcentral gyri, with some additional small foci noted more anteriorly in the right frontal lobe as well as more inferiorly in the right parietal and posterior right temporal lobes. A punctate focus of susceptibility artifact in the region of the left external capsule is compatible with remote microhemorrhage. There is moderate  cerebral atrophy. There is no mass, midline shift, or extra-axial fluid collection.  Orbits are unremarkable. Frontal sinuses are hypoplastic. There is minimal bilateral anterior ethmoid air cell and maxillary sinus mucosal thickening. Mastoid air cells are clear. Major intracranial vascular flow voids are preserved. There is grade 1 anterolisthesis of C3 on C4.  MRA HEAD FINDINGS  Visualized distal vertebral arteries are patent and codominant. PICA, AICA, and SCA origins are patent. Basilar artery is patent without stenosis. PCAs are unremarkable aside from mild distal branch vessel irregularity and attenuation.  Internal carotid arteries are patent from skullbase to carotid termini. ACAs are unremarkable. MCAs are unremarkable aside from minimal distal branch vessel irregularity. Anterior communicating artery is patent. No intracranial aneurysm is identified.  IMPRESSION: 1. Multiple punctate cortical infarcts in the right MCA territory, predominantly in the right frontal and parietal lobes. 2. No evidence of major intracranial arterial occlusion or significant proximal stenosis. Very mild distal branch vessel irregularity may reflect mild atherosclerotic change.   Electronically Signed   By: Sebastian Ache   On: 07/14/2014 09:48          Carotid Doppler  1-39% ICA stenosis. Vertebral  artery flow is antegrade.  2D Echocardiogram  Left ventricle: The cavity size was normal. Wall thickness was normal. Systolic function was normal. The estimated ejection fraction was in the range of 50% to 55%. There is akinesis of the basal-midinferolateral and inferior myocardium.    CXR  1. No radiographic evidence of acute cardiopulmonary disease.  2. Mild cardiomegaly.  3. Atherosclerosis.  EKG 7/26 3:37am - STEMI at inferior leads   PT/OT Recommendations pending  Physical Exam Temp:  [97.6 F (36.4 C)-97.9 F (36.6 C)] 97.9 F (36.6 C) (07/28 0623) Pulse Rate:  [66-76] 74 (07/28 1147) Resp:  [18] 18 (07/28 0623) BP: (106-134)/(47-60) 114/60 mmHg (07/28 1146) SpO2:  [95 %-100 %] 95 % (07/28 0623)  General - Well nourished, well developed, sleepy after morphine for back pain.  Ophthalmologic - not able to see through.  Cardiovascular - Regular rate and rhythm with no murmur.  Lung - clear to auscultation bilaterally   Mental Status -  Level of arousal was sleepy due to medication and orientation to time, place, and person was intact. Language including expression, naming, repetition, comprehension was assessed and found intact.  Cranial Nerves II - XII - II - Vision intact OU. Visual fields intact . III, IV, VI - PERRL, extraocular movements intact. V - Facial sensation intact bilaterally. VII - Facial movement intact bilaterally. VIII - Hearing & vestibular intact bilaterally. X - Palate elevates symmetrically. XI - Chin turning & shoulder shrug intact bilaterally. XII - Tongue protrusion intact.  Motor Strength - mild left grip weakness and diminished fine finger movements on left., distal UE muscle strength 5/5. RUE 5/5, BLE 4/5 symmetrical Bulk was normal   Motor Tone - Muscle tone was assessed at the neck and appendages and was normal. Reflexes - The patient's reflexes were normal in all extremities and she had no pathological reflexes.  Sensory - Light  touch, temperature/pinprick were assessed and were normal.    Coordination - The patient had normal movements in the hands and feet with no ataxia or dysmetria.  Tremor was absent.  Gait and Station - not tested.  ASSESSMENT  Ms. ARNA LUIS is a 80 y.o. female presenting with left UE numbness tingling. Hx of HLD and PE. NIHSS = 2. CT no acute abnormality. Overnight, pt had STEMI and underwent cardiac  cath with stenting. MRI confirms embolic right brain infarcts likely related to acute MI   LDL 151  A1C 5.6  Hx of PE  TREATMENT/PLAN  Continue ASA for stroke prevention. Cardiology added ticargrelor for STEMI.   Continue statin for stroke prevention   Stroke risk factor modification  Mobilize out of bed.  DC home today if okay with cardiology.  Outpatient followup with Dr. Roda Shutters at vascular neurologic clinic in 4 weeks Stroke team to sign off. Kindly call for questions  SIGNED Delia Heady, MD  07/14/2014 1:07 PM   To contact Stroke Continuity provider, please refer to [email or web address]. After hours, contact General Neurology

## 2014-07-14 NOTE — Discharge Summary (Signed)
CARDIOLOGY DISCHARGE SUMMARY   Patient ID: Alexandria Castillo MRN: [id number] DOB/AGE: 04-10-1929 80 y.o.  Admit date: 07/11/2014 Discharge date: 07/14/2014  PCP: Eber Hong, MD Primary Cardiologist: Will follow up in [facility]  Primary Discharge Diagnosis:   ST elevation myocardial infarction (STEMI) of inferior wall - 99% subtotal thrombotic occlusion of the RCA; status post PCI  Secondary Discharge Diagnosis:    Possible TIA (transient ischemic attack) - initial symptoms concerning for TIA/CVA; most likely not neurologic as left arm numbness was more consistent with angina   Numbness and tingling in left arm   CAD S/P percutaneous coronary angioplasty: Promus Premier DES   Presence of drug coated stent in right coronary artery  Consults: Neurology  Procedures: Left heart catheterization, coronary angiogram, left ventriculogram, PTCA and drug-eluting stent to the mid RCA, 2-D echocardiogram, CT of the head without contrast, MRI of the brain without contrast, MRA of the head without contrast  Hospital Course: Alexandria Castillo is a 80 y.o. female with no history of CAD. She was initially admitted to the hospital by internal medicine on 07/25 with left arm weakness, numbness and tingling. She was seen by neurology and studies were done to evaluate for CVA. Infarcts were seen on the MRI. She was followed by neurology during her hospital stay and will follow up with him after discharge. She is on aspirin and a statin and is to avoid hypotension. She was seen by physical therapy and occupational therapy.  On 07/26, she developed 10/10 chest pain. An ECG was obtained which revealed ST elevation in the inferior leads. A code STEMI he was called. Cardiology evaluated her and took her to the Cath Lab.  Cardiac catheterization results are below. She had a drug-eluting stent to the LAD with nonobstructive disease in other vessels, to be treated medically. Her EF was 55-60% with inferior wall motion  abnormalities. She tolerated the procedure well. She was not initially on a beta blocker because she had some bradycardia. She was already on aspirin this was continued. She was started on Brilinta. She reportedly had problems with Lipitor in the past but was willing to try a low dose of a statin and this was initiated. Her heart rate stabilized and a beta blocker was added to her medication regimen.  A 2-D echocardiogram was performed that showed an EF of 50-55% with grade 2 diastolic dysfunction. She was seen by case management for assistance with obtaining Brilinta. Dr. Leonie Man recommended outpatient speech therapy and a one-month followup with neurology. The patient is refusing the speech therapy but agrees to physical therapy. Physical therapy recommended home health PT. She was seen by inpatient cardiac rehabilitation and educated on MI restrictions, stent guidelines and heart-healthy lifestyle modifications. Currently, she is too weak for outpatient cardiac rehabilitation. Perhaps this can be considered in the future.  On 02/28, she was seen by Dr. Beau Fanny and all data were reviewed. No further neurology workup was recommended as an inpatient. She was weak but was making some progress with physical therapy and this can be continued as an outpatient. No further inpatient workup is indicated and she is considered stable for discharge, to follow up as an outpatient.  Labs:   Lab Results  Component Value Date   WBC 8.8 07/13/2014   HGB 12.6 07/13/2014   HCT 38.3 07/13/2014   MCV 88.7 07/13/2014   PLT 163 07/13/2014    Recent Labs Lab 07/11/14 0344  07/13/14 0250  NA 140  --  142  K 3.7  --  3.4*  CL 102  --  107  CO2 27  --  24  BUN 18  --  16  CREATININE 0.82  < > 0.84  CALCIUM 9.7  --  9.0  PROT 6.5  --   --   BILITOT 0.4  --   --   ALKPHOS 74  --   --   ALT 7  --   --   AST 12  --   --   GLUCOSE 108*  --  91  < > = values in this interval not displayed.  Recent Labs  07/12/14 0402  07/12/14 1030 07/12/14 1609  TROPONINI >20.00* >20.00* >20.00*   Lipid Panel     Component Value Date/Time   CHOL 217* 07/11/2014 0925   TRIG 79 07/11/2014 0925   HDL 50 07/11/2014 0925   CHOLHDL 4.3 07/11/2014 0925   VLDL 16 07/11/2014 0925   LDLCALC 151* 07/11/2014 0925   Radiology: Dg Chest 2 View 07/11/2014   CLINICAL DATA:  Stroke.  EXAM: CHEST  2 VIEW  COMPARISON:  Chest x-ray 10/23/2012.  FINDINGS: Lung volumes are normal. No consolidative airspace disease. No pleural effusions. No pneumothorax. No evidence of pulmonary edema. Mild cardiomegaly. Upper mediastinal contours are within normal limits. Atherosclerosis in the thoracic aorta.  IMPRESSION: 1. No radiographic evidence of acute cardiopulmonary disease. 2. Mild cardiomegaly. 3. Atherosclerosis.   Electronically Signed   By: Vinnie Langton M.D.   On: 07/11/2014 10:42   Ct Head Wo Contrast 07/11/2014   CLINICAL DATA:  Code stroke. Left arm and elbow numbness and pain. Weakness.  EXAM: CT HEAD WITHOUT CONTRAST  TECHNIQUE: Contiguous axial images were obtained from the base of the skull through the vertex without intravenous contrast.  COMPARISON:  None.  FINDINGS: There is no evidence of acute infarction, mass lesion, or intra- or extra-axial hemorrhage on CT.  Prominence of the ventricles and sulci reflects mild cortical volume loss. Mild cerebellar atrophy is noted. Mild periventricular white matter change likely reflects small vessel ischemic microangiopathy.  The brainstem and fourth ventricle are within normal limits. The basal ganglia are unremarkable in appearance. The cerebral hemispheres demonstrate grossly normal gray-white differentiation. No mass effect or midline shift is seen.  There is no evidence of fracture; visualized osseous structures are unremarkable in appearance. The orbits are within normal limits. The paranasal sinuses and mastoid air cells are well-aerated. No significant soft tissue abnormalities are seen.   IMPRESSION: 1. No acute intracranial pathology seen on CT. 2. Mild cortical volume loss and scattered small vessel ischemic microangiopathy.  These results were called by telephone at the time of interpretation on 07/11/2014 at 3:18 am to Dr. Shanon Rosser, who verbally acknowledged these results.   Electronically Signed   By: Garald Balding M.D.   On: 07/11/2014 03:19   Mr Jodene Nam Head/brain Wo Cm 07/14/2014   CLINICAL DATA:  Stroke. New onset left upper extremity numbness and tingling.  EXAM: MRI HEAD WITHOUT CONTRAST  MRA HEAD WITHOUT CONTRAST  TECHNIQUE: Multiplanar, multiecho pulse sequences of the brain and surrounding structures were obtained without intravenous contrast. Angiographic images of the head were obtained using MRA technique without contrast.  COMPARISON:  Head CT 07/11/2014  FINDINGS: MRI HEAD FINDINGS  There are multiple punctate foci cortical restricted diffusion in the right frontal and parietal lobes, predominantly involving the precentral and postcentral gyri, with some additional small foci noted more anteriorly in the right frontal lobe as well as more  inferiorly in the right parietal and posterior right temporal lobes. A punctate focus of susceptibility artifact in the region of the left external capsule is compatible with remote microhemorrhage. There is moderate cerebral atrophy. There is no mass, midline shift, or extra-axial fluid collection.  Orbits are unremarkable. Frontal sinuses are hypoplastic. There is minimal bilateral anterior ethmoid air cell and maxillary sinus mucosal thickening. Mastoid air cells are clear. Major intracranial vascular flow voids are preserved. There is grade 1 anterolisthesis of C3 on C4.  MRA HEAD FINDINGS  Visualized distal vertebral arteries are patent and codominant. PICA, AICA, and SCA origins are patent. Basilar artery is patent without stenosis. PCAs are unremarkable aside from mild distal branch vessel irregularity and attenuation.  Internal carotid  arteries are patent from skullbase to carotid termini. ACAs are unremarkable. MCAs are unremarkable aside from minimal distal branch vessel irregularity. Anterior communicating artery is patent. No intracranial aneurysm is identified.  IMPRESSION: 1. Multiple punctate cortical infarcts in the right MCA territory, predominantly in the right frontal and parietal lobes. 2. No evidence of major intracranial arterial occlusion or significant proximal stenosis. Very mild distal branch vessel irregularity may reflect mild atherosclerotic change.   Electronically Signed   By: Logan Bores   On: 07/14/2014 09:48    Cardiac Cath: 07/12/2014 Hemodynamics:  Central Aortic Pressure / Mean: 143/84/101 mmHg  Left Ventricular Pressure / LVEDP: 144/10/14 mmHg Left Ventriculography:  EF: 55-60 %  Wall Motion: Basal inferior hypokinesis Coronary Anatomy:  Dominance: Right Left Main: Large caliber vessel that bifurcates into the LAD and Circumflex. Angiographically normal with mild calcification. LAD: Normal caliber vessel with a large proximal septal perforator and 2 diagonal branches. The vessel that reaches and wraps around the apex perfusing the distal third of the inferior apex. Minimal luminal irregularities with the exception of a focal 40% stenosis just after D2.  D1: Moderate caliber vessel that comes off just beyond the septal perforator. Minimal luminal irregularities.  D2: Moderate caliber vessel from the mid LAD. Minimal luminal irregularities. Left Circumflex: Large caliber, nondominant vessel with a proximal OM1. The main branch then terminates as a large lateral OM 2 with a very small AV groove circumflex. Minimal luminal irregularities. OM 2 bifurcates this  RCA: Large caliber, dominant vessel that is leading to the PDA and Right Posterior AV Groove Branch (RPAV). There is a focal thrombotic 99% subtotal occlusion in the mid vessel just after the RV marginal branch. There is TIMI 2 flow distally. The  remainder the RCA system is free of any significant disease just mild luminal irregularities.  Post PCI angiography revealed a very extensive posterolateral system that was not fully visualized prior to stenting. There are least 3 posterolateral branches with brisk TIMI-3 flow post PCI After reviewing the initial angiography, the culprit lesion was thought to be 99% thrombotic subtotal occlusion of the RCA. Preparation were made to proceed with PCI on this lesion.  Percutaneous Coronary Intervention: Diagnostic angiography was performed with the guide catheter and  Lesion data: Mid-RCA 99% thrombotic occlusion - reduced to 0%;  TIMI 2 flow pre-PCI --> TIMI-3 flow post PCI  Guide: 6 Fr JR 4 Guidewire: Pro-water  Predilation Balloon: Euphora 2.5 mm x 12 mm;  10 Atm x 20 Sec - post inflation angiography revealed TIMI 3 flow distally but with significant mild localized dissection and thrombus in the residual lesion. Stent: Promus Premier DES 3.5 mm x 15 mm;  Max inflation 16 Atm x 30 Sec Post-dilation Balloon: [place] Euphora 3.75  mm x 12 mm;  16 Atm x 30 Sec, 60 Atm x 45 Sec  Final Diameter: 3.8 mm Post deployment angiography in multiple views, with and without guidewire in place revealed excellent stent deployment and lesion coverage. There was no evidence of dissection or perforation.  MEDICATIONS:  Anesthesia: Local Lidocaine 10 ml Sedation: 1 mg IV Versed, 25 mcg IV fentanyl ;  Premedication: 325 mg aspirin Omnipaque Contrast: 165 ml  Anticoagulation: Angiomax Bolus & drip  Anti-Platelet Agent: Brilinta 180 mg PATIENT DISPOSITION:  The patient was transferred to the PACU holding area in a hemodynamicaly stable, chest pain free condition.  The patient tolerated the procedure well, and there were no complications. EBL: < 10 ml  The patient was stable before, during, and after the procedure. POST-OPERATIVE DIAGNOSIS:  Severe single-vessel disease with thrombotic subtotal occlusion of the mid RCA  treated successfully with a single Promus. DES stent 3.5 mm and 15 mm postdilated at 3.8 mm.  Otherwise only mild/moderate focal disease in the mid LAD with minimal residual disease.  Well-preserved LVEF with basal inferior hypokinesis, but relatively normal LVEDP  Short run of AIVR following reperfusion, then no additional arrhythmias noted PLAN OF CARE:  She is already been transferred to the CCU where she will remain for post catheterization care.  Provided she remains stable over the next day or so I think she can probably fast-track discharge as she did have partial reperfusion upon arrival to the Cath Lab and his now hemodynamic stable with normal EF and EDP  Dual independent therapy for minimum one year.  I would suspect that the left arm numbness was more of an anginal equivalent as opposed to possible stroke, however will not stop the stroke evaluation already in process.  Since she is wheelchair bound following an accident, I will use subcutaneous Lovenox for DVT prophylaxis.  She is not on statin or beta blocker. She does have a statin allergy listed, we'll need to reassess what this actually was. I will hold off on beta blocker until I see what her heart rate response will be post PCI. If her heart rate remains stable would like to have one started prior to discharge.   EKG: 07/13/2014 Sinus rhythm, ST elevation has resolved Vent. rate 64 BPM PR interval 152 ms QRS duration 132 ms QT/QTc 430/443 ms P-R-T axes 42 -4 -58  Echo: 07/12/2014 - Left ventricle: The cavity size was normal. Wall thickness was normal. Systolic function was normal. The estimated ejection fraction was in the range of 50% to 55%. There is akinesis of the basal-midinferolateral and inferior myocardium. Features are consistent with a pseudonormal left ventricular filling pattern, with concomitant abnormal relaxation and increased filling pressure (grade 2 diastolic dysfunction). - Mitral valve: Calcified  annulus. There was mild regurgitation. - Left atrium: The atrium was moderately dilated. - Right atrium: The atrium was mildly dilated. Central venous pressure (est): 3 mm Hg. - Atrial septum: No defect or patent foramen ovale was identified. - Tricuspid valve: There was trivial regurgitation. - Pulmonary arteries: Systolic pressure could not be accurately estimated. - Pericardium, extracardiac: There was no pericardial effusion. Impressions: - Normal LV wall thickness with LVEF 50-55%, mid to basal inferolateral akinesis, grade 2 diastolic dysfunction. Moderate left atrial enlargement. Mild mitral regurgitation. Unable to assess PASP.  FOLLOW UP PLANS AND APPOINTMENTS Allergies  Allergen Reactions  . Baycol [Cerivastatin]   . Ciprofloxacin   . Decadron [Dexamethasone]   . Imipramine   . Oxycodone   . Premarin [  Conjugated Estrogens]   . Synthroid [Levothyroxine]      Medication List    STOP taking these medications       meloxicam 7.5 MG tablet  Commonly known as:  MOBIC      TAKE these medications       aspirin 81 MG tablet  Take 1 tablet (81 mg total) by mouth daily.     atorvastatin 10 MG tablet  Commonly known as:  LIPITOR  Take 1 tablet (10 mg total) by mouth daily at 6 PM.     CALTRATE 600 PLUS-VIT D PO  Take 1 tablet by mouth daily.     Fish Oil 1200 MG Caps  Take 1,200 mg by mouth daily.     metoprolol succinate 25 MG 24 hr tablet  Commonly known as:  TOPROL XL  Take 1 tablet (25 mg total) by mouth daily.     OVER THE COUNTER MEDICATION  Take 1 capsule by mouth daily. [place]ora 50 Billion units     ramipril 2.5 MG capsule  Commonly known as:  ALTACE  Take 1 capsule (2.5 mg total) by mouth daily.     ticagrelor 90 MG Tabs tablet  Commonly known as:  BRILINTA  Take 1 tablet (90 mg total) by mouth 2 (two) times daily.     traMADol 50 MG tablet  Commonly known as:  ULTRAM  Take 50-100 mg by mouth every 6 (six) hours as needed for moderate pain or  severe pain. Pain.     vitamin B-12 500 MCG tablet  Commonly known as:  CYANOCOBALAMIN  Take 500 mcg by mouth daily.     Vitamin D (Ergocalciferol) 50000 UNITS Caps capsule  Commonly known as:  DRISDOL  Take 50,000 Units by mouth every 7 (seven) days.         Follow-up Information   Follow up with Xu,Jindong, MD On 08/26/2014. (Please arrived at 1:15 PM for a 1:30 PM appointment)    Specialty:  Neurology   Contact information:   [address] [place] [address] [phone]       BRING ALL MEDICATIONS WITH YOU TO FOLLOW UP APPOINTMENTS  Time spent with patient to include physician time: 38 min Signed: Rosaria Ferries, PA-C 07/14/2014, 1:24 PM Co-Sign MD  I have examined the patient and reviewed assessment and plan and discussed with patient.  Agree with above as stated.  Doing well from cardiac standpoint.  Detailed neuro w/u performed.  F/u arranged.  Caprina Wussow S.

## 2014-07-27 ENCOUNTER — Emergency Department (HOSPITAL_COMMUNITY): Payer: Medicare PPO

## 2014-07-27 ENCOUNTER — Inpatient Hospital Stay (HOSPITAL_COMMUNITY)
Admission: EM | Admit: 2014-07-27 | Discharge: 2014-07-30 | DRG: 066 | Disposition: A | Discharge status: Home Health | Payer: Medicare PPO | Attending: Internal Medicine | Admitting: Internal Medicine

## 2014-07-27 ENCOUNTER — Encounter (HOSPITAL_COMMUNITY): Payer: Self-pay | Admitting: Emergency Medicine

## 2014-07-27 ENCOUNTER — Inpatient Hospital Stay (HOSPITAL_COMMUNITY): Payer: Medicare PPO

## 2014-07-27 DIAGNOSIS — Z8673 Personal history of transient ischemic attack (TIA), and cerebral infarction without residual deficits: Secondary | ICD-10-CM

## 2014-07-27 DIAGNOSIS — I6350 Cerebral infarction due to unspecified occlusion or stenosis of unspecified cerebral artery: Principal | ICD-10-CM | POA: Diagnosis present

## 2014-07-27 DIAGNOSIS — E7800 Pure hypercholesterolemia, unspecified: Secondary | ICD-10-CM | POA: Diagnosis present

## 2014-07-27 DIAGNOSIS — Z9861 Coronary angioplasty status: Secondary | ICD-10-CM | POA: Diagnosis not present

## 2014-07-27 DIAGNOSIS — I252 Old myocardial infarction: Secondary | ICD-10-CM

## 2014-07-27 DIAGNOSIS — H9190 Unspecified hearing loss, unspecified ear: Secondary | ICD-10-CM | POA: Diagnosis present

## 2014-07-27 DIAGNOSIS — I2510 Atherosclerotic heart disease of native coronary artery without angina pectoris: Secondary | ICD-10-CM | POA: Diagnosis present

## 2014-07-27 DIAGNOSIS — R5381 Other malaise: Secondary | ICD-10-CM | POA: Diagnosis present

## 2014-07-27 DIAGNOSIS — R29898 Other symptoms and signs involving the musculoskeletal system: Secondary | ICD-10-CM

## 2014-07-27 DIAGNOSIS — Z86711 Personal history of pulmonary embolism: Secondary | ICD-10-CM | POA: Diagnosis not present

## 2014-07-27 DIAGNOSIS — R5383 Other fatigue: Secondary | ICD-10-CM | POA: Diagnosis present

## 2014-07-27 DIAGNOSIS — Z955 Presence of coronary angioplasty implant and graft: Secondary | ICD-10-CM

## 2014-07-27 DIAGNOSIS — I639 Cerebral infarction, unspecified: Secondary | ICD-10-CM | POA: Diagnosis present

## 2014-07-27 HISTORY — DX: Atherosclerotic heart disease of native coronary artery without angina pectoris: I25.10

## 2014-07-27 HISTORY — DX: Acute myocardial infarction, unspecified: I21.9

## 2014-07-27 HISTORY — DX: Cerebral infarction, unspecified: I63.9

## 2014-07-27 LAB — COMPREHENSIVE METABOLIC PANEL
ALBUMIN: 3.5 g/dL (ref 3.5–5.2)
ALT: 8 U/L (ref 0–35)
ANION GAP: 12 (ref 5–15)
AST: 15 U/L (ref 0–37)
Alkaline Phosphatase: 76 U/L (ref 39–117)
BUN: 15 mg/dL (ref 6–23)
CALCIUM: 9.6 mg/dL (ref 8.4–10.5)
CO2: 24 mEq/L (ref 19–32)
CREATININE: 1.18 mg/dL — AB (ref 0.50–1.10)
Chloride: 98 mEq/L (ref 96–112)
GFR calc Af Amer: 48 mL/min — ABNORMAL LOW (ref >90)
GFR calc non Af Amer: 42 mL/min — ABNORMAL LOW (ref >90)
Glucose, Bld: 112 mg/dL — ABNORMAL HIGH (ref 70–99)
Potassium: 3.9 mEq/L (ref 3.7–5.3)
Sodium: 134 mEq/L — ABNORMAL LOW (ref 137–147)
TOTAL PROTEIN: 6.8 g/dL (ref 6.0–8.3)
Total Bilirubin: 0.4 mg/dL (ref 0.3–1.2)

## 2014-07-27 LAB — RAPID URINE DRUG SCREEN, HOSP PERFORMED
AMPHETAMINES: NOT DETECTED
Barbiturates: NOT DETECTED
Benzodiazepines: NOT DETECTED
COCAINE: NOT DETECTED
OPIATES: NOT DETECTED
Tetrahydrocannabinol: NOT DETECTED

## 2014-07-27 LAB — DIFFERENTIAL
BASOS PCT: 0 % (ref 0–1)
Basophils Absolute: 0 10*3/uL (ref 0.0–0.1)
EOS ABS: 0.4 10*3/uL (ref 0.0–0.7)
Eosinophils Relative: 4 % (ref 0–5)
Lymphocytes Relative: 18 % (ref 12–46)
Lymphs Abs: 1.9 10*3/uL (ref 0.7–4.0)
MONO ABS: 0.8 10*3/uL (ref 0.1–1.0)
Monocytes Relative: 8 % (ref 3–12)
Neutro Abs: 7.2 10*3/uL (ref 1.7–7.7)
Neutrophils Relative %: 70 % (ref 43–77)

## 2014-07-27 LAB — URINE MICROSCOPIC-ADD ON

## 2014-07-27 LAB — I-STAT CHEM 8, ED
BUN: 15 mg/dL (ref 6–23)
CALCIUM ION: 1.25 mmol/L (ref 1.13–1.30)
CHLORIDE: 99 meq/L (ref 96–112)
Creatinine, Ser: 1.2 mg/dL — ABNORMAL HIGH (ref 0.50–1.10)
Glucose, Bld: 112 mg/dL — ABNORMAL HIGH (ref 70–99)
HEMATOCRIT: 40 % (ref 36.0–46.0)
Hemoglobin: 13.6 g/dL (ref 12.0–15.0)
Potassium: 3.6 mEq/L — ABNORMAL LOW (ref 3.7–5.3)
Sodium: 135 mEq/L — ABNORMAL LOW (ref 137–147)
TCO2: 22 mmol/L (ref 0–100)

## 2014-07-27 LAB — URINALYSIS, ROUTINE W REFLEX MICROSCOPIC
Bilirubin Urine: NEGATIVE
GLUCOSE, UA: NEGATIVE mg/dL
KETONES UR: NEGATIVE mg/dL
Leukocytes, UA: NEGATIVE
Nitrite: NEGATIVE
Protein, ur: NEGATIVE mg/dL
Specific Gravity, Urine: <1.005 — ABNORMAL LOW (ref 1.005–1.030)
Urobilinogen, UA: 0.2 mg/dL (ref 0.0–1.0)
pH: 6 (ref 5.0–8.0)

## 2014-07-27 LAB — TROPONIN I
Troponin I: <0.3 ng/mL (ref <0.30)
Troponin I: <0.3 ng/mL (ref <0.30)

## 2014-07-27 LAB — CBC
HEMATOCRIT: 37.9 % (ref 36.0–46.0)
Hemoglobin: 12.9 g/dL (ref 12.0–15.0)
MCH: 30 pg (ref 26.0–34.0)
MCHC: 34 g/dL (ref 30.0–36.0)
MCV: 88.1 fL (ref 78.0–100.0)
Platelets: 234 10*3/uL (ref 150–400)
RBC: 4.3 MIL/uL (ref 3.87–5.11)
RDW: 15.9 % — ABNORMAL HIGH (ref 11.5–15.5)
WBC: 10.4 10*3/uL (ref 4.0–10.5)

## 2014-07-27 LAB — PROTIME-INR
INR: 0.93 (ref 0.00–1.49)
Prothrombin Time: 12.5 seconds (ref 11.6–15.2)

## 2014-07-27 LAB — I-STAT TROPONIN, ED: TROPONIN I, POC: 0.06 ng/mL (ref 0.00–0.08)

## 2014-07-27 LAB — ETHANOL

## 2014-07-27 LAB — APTT: aPTT: 30 seconds (ref 24–37)

## 2014-07-27 MED ORDER — HYDROCODONE-ACETAMINOPHEN 5-325 MG PO TABS
1.0000 | ORAL_TABLET | Freq: Four times a day (QID) | ORAL | Status: DC | PRN
  Administered 2014-07-27 – 2014-07-30 (×5): 1 via ORAL
  Filled 2014-07-27 (×7): qty 1

## 2014-07-27 MED ORDER — SODIUM CHLORIDE 0.9 % IV SOLN: 250.0000 mL | INTRAVENOUS | Status: DC | PRN

## 2014-07-27 MED ORDER — ASPIRIN EC 81 MG PO TBEC
81.0000 mg | DELAYED_RELEASE_TABLET | Freq: Every day | ORAL | Status: DC
  Administered 2014-07-27 – 2014-07-30 (×3): 81 mg via ORAL
  Filled 2014-07-27 (×2): qty 1

## 2014-07-27 MED ORDER — ONDANSETRON HCL 4 MG/2ML IJ SOLN: 4.0000 mg | Freq: Four times a day (QID) | INTRAMUSCULAR | Status: DC | PRN

## 2014-07-27 MED ORDER — TRAMADOL HCL 50 MG PO TABS
50.0000 mg | ORAL_TABLET | Freq: Once | ORAL | Status: AC
  Administered 2014-07-27: 50 mg via ORAL

## 2014-07-27 MED ORDER — ONDANSETRON HCL 4 MG PO TABS
4.0000 mg | ORAL_TABLET | Freq: Four times a day (QID) | ORAL | Status: DC | PRN
Start: 2014-07-27 — End: 2014-07-30

## 2014-07-27 MED ORDER — TRAMADOL HCL 50 MG PO TABS
ORAL_TABLET | ORAL | Status: AC
  Filled 2014-07-27: qty 1

## 2014-07-27 MED ORDER — SODIUM CHLORIDE 0.9 % IJ SOLN: 3.0000 mL | INTRAMUSCULAR | Status: DC | PRN

## 2014-07-27 MED ORDER — TICAGRELOR 90 MG PO TABS
90.0000 mg | ORAL_TABLET | Freq: Two times a day (BID) | ORAL | Status: DC
  Administered 2014-07-27 – 2014-07-30 (×6): 90 mg via ORAL
  Filled 2014-07-27 (×9): qty 1

## 2014-07-27 MED ORDER — SODIUM CHLORIDE 0.9 % IJ SOLN
3.0000 mL | Freq: Two times a day (BID) | INTRAMUSCULAR | Status: DC
  Administered 2014-07-27 – 2014-07-30 (×5): 3 mL via INTRAVENOUS

## 2014-07-27 MED ORDER — TRAMADOL HCL 50 MG PO TABS
50.0000 mg | ORAL_TABLET | Freq: Four times a day (QID) | ORAL | Status: DC | PRN
  Administered 2014-07-27: 100 mg via ORAL
  Administered 2014-07-27: 50 mg via ORAL
  Administered 2014-07-28 – 2014-07-29 (×4): 100 mg via ORAL
  Filled 2014-07-27 (×6): qty 2

## 2014-07-27 MED ORDER — CETYLPYRIDINIUM CHLORIDE 0.05 % MT LIQD
7.0000 mL | Freq: Two times a day (BID) | OROMUCOSAL | Status: DC
  Administered 2014-07-27 – 2014-07-30 (×6): 7 mL via OROMUCOSAL

## 2014-07-27 MED ORDER — ATORVASTATIN CALCIUM 10 MG PO TABS
10.0000 mg | ORAL_TABLET | Freq: Every day | ORAL | Status: DC
  Administered 2014-07-27 – 2014-07-29 (×3): 10 mg via ORAL
  Filled 2014-07-27 (×3): qty 1

## 2014-07-27 MED ORDER — SODIUM CHLORIDE 0.9 % IJ SOLN
3.0000 mL | Freq: Two times a day (BID) | INTRAMUSCULAR | Status: DC
  Administered 2014-07-27 – 2014-07-30 (×6): 3 mL via INTRAVENOUS

## 2014-07-27 MED ORDER — ACETAMINOPHEN 650 MG RE SUPP: 650.0000 mg | Freq: Four times a day (QID) | RECTAL | Status: DC | PRN

## 2014-07-27 MED ORDER — ENOXAPARIN SODIUM 40 MG/0.4ML ~~LOC~~ SOLN
40.0000 mg | SUBCUTANEOUS | Status: DC
  Administered 2014-07-27 – 2014-07-30 (×2): 40 mg via SUBCUTANEOUS
  Filled 2014-07-27 (×3): qty 0.4

## 2014-07-27 MED ORDER — ACETAMINOPHEN 325 MG PO TABS: 650.0000 mg | ORAL_TABLET | Freq: Four times a day (QID) | ORAL | Status: DC | PRN

## 2014-07-27 MED ORDER — ASPIRIN 81 MG PO CHEW
324.0000 mg | CHEWABLE_TABLET | Freq: Once | ORAL | Status: AC
  Administered 2014-07-27: 324 mg via ORAL
  Filled 2014-07-27: qty 4

## 2014-07-27 NOTE — ED Notes (Signed)
Pt arrives via pov with her son.  Pt told her son that she thought she was having a stroke, but pt only c/o left arm pain that started last night.  Pt states she has had a stroke in the past and she states she had an MI last week

## 2014-07-27 NOTE — ED Provider Notes (Signed)
CSN: [id number]     Arrival date & time 07/27/14  0257 History   First MD Initiated Contact with Patient 07/27/14 0304     Chief Complaint  Patient presents with  . Arm Pain    HPI Patient presents to the emergency room with complaints of arm pain and weakness. Patient went to bed at approximately 9 PM this evening. She woke up in the morning maybe around 1:30 AM. She noticed that she was having pain in her left arm as well as difficulty moving her left arm and hand. Patient does not have any trouble with chest pain. She denies any shortness of breath.   She was recently in the hospital the end of July. At that time she was admitted for trouble with arm weakness. She eventually had an MRI of the brain showing right frontal and parietal strokes. While in the hospital she developed acute pain in her arm and had an EKG showing ST elevation MI. She underwent cardiac catheterization and had a stent placed. Past Medical History  Diagnosis Date  . Hiatal hernia   . Hypercholesterolemia   . Fractured tibia   . Hip pain   . PE (pulmonary embolism)   . Complication of anesthesia   . PONV (postoperative nausea and vomiting)   . Myocardial infarct   . Stroke   . Coronary artery disease    Past Surgical History  Procedure Laterality Date  . Abdominal hysterectomy    . Leg surgery    . Esophagogastroduodenoscopy (egd) with esophageal dilation  12/04/2012    Procedure: ESOPHAGOGASTRODUODENOSCOPY (EGD) WITH ESOPHAGEAL DILATION;  Surgeon: Malissa Hippo, MD;  Location: [facility];  Service: Endoscopy;  Laterality: N/A;  255-rescheduled to 13:00 Ann to notify pt  . Fracture surgery      Left Knee  . Coronary stent placement     No family history on file. History  Substance Use Topics  . Smoking status: Never Smoker   . Smokeless tobacco: Not on file  . Alcohol Use: No   OB History   Grav Para Term Preterm Abortions TAB SAB Ect Mult Living                 Review of Systems  All other  systems reviewed and are negative.     Allergies  Baycol; Ciprofloxacin; Decadron; Imipramine; Oxycodone; Premarin; and Synthroid  Home Medications   Prior to Admission medications   Medication Sig Start Date End Date Taking? Authorizing Provider  aspirin 81 MG tablet Take 1 tablet (81 mg total) by mouth daily. 07/14/14  Yes Rhonda G Barrett, PA-C  atorvastatin (LIPITOR) 10 MG tablet Take 1 tablet (10 mg total) by mouth daily at 6 PM. 07/14/14  Yes Rhonda G Barrett, PA-C  Calcium-Vitamin D (CALTRATE 600 PLUS-VIT D PO) Take 1 tablet by mouth daily.   Yes Historical Provider, MD  metoprolol succinate (TOPROL XL) 25 MG 24 hr tablet Take 1 tablet (25 mg total) by mouth daily. 07/14/14  Yes Rhonda G Barrett, PA-C  Omega-3 Fatty Acids (FISH OIL) 1200 MG CAPS Take 1,200 mg by mouth daily.   Yes Historical Provider, MD  OVER THE COUNTER MEDICATION Take 1 capsule by mouth daily. Flora 50 Billion units   Yes Historical Provider, MD  ramipril (ALTACE) 2.5 MG capsule Take 1 capsule (2.5 mg total) by mouth daily. 07/14/14  Yes Rhonda G Barrett, PA-C  ticagrelor (BRILINTA) 90 MG TABS tablet Take 1 tablet (90 mg total) by mouth 2 (two)  times daily. 07/14/14  Yes Rhonda G Barrett, PA-C  traMADol (ULTRAM) 50 MG tablet Take 50-100 mg by mouth every 6 (six) hours as needed for moderate pain or severe pain. Pain.   Yes Historical Provider, MD  vitamin B-12 (CYANOCOBALAMIN) 500 MCG tablet Take 500 mcg by mouth daily.   Yes Historical Provider, MD  Vitamin D, Ergocalciferol, (DRISDOL) 50000 UNITS CAPS Take 50,000 Units by mouth every 7 (seven) days.   Yes Historical Provider, MD   BP 144/73  Pulse 73  Temp(Src) 97.6 F (36.4 C) (Oral)  Resp 15  Ht 5\' 3"  (1.6 m)  Wt 192 lb (87.091 kg)  BMI 34.02 kg/m2  SpO2 95% Physical Exam  Nursing note and vitals reviewed. Constitutional: She is oriented to person, place, and time. She appears well-developed and well-nourished. No distress.  HENT:  Head: Normocephalic  and atraumatic.  Right Ear: External ear normal.  Left Ear: External ear normal.  Mouth/Throat: Oropharynx is clear and moist.  Eyes: Conjunctivae are normal. Right eye exhibits no discharge. Left eye exhibits no discharge. No scleral icterus.  Neck: Neck supple. No tracheal deviation present.  Cardiovascular: Normal rate, regular rhythm and intact distal pulses.   Pulmonary/Chest: Effort normal and breath sounds normal. No stridor. No respiratory distress. She has no wheezes. She has no rales.  Abdominal: Soft. Bowel sounds are normal. She exhibits no distension. There is no tenderness. There is no rebound and no guarding.  Musculoskeletal: She exhibits no edema and no tenderness.  Neurological: She is alert and oriented to person, place, and time. No cranial nerve deficit (No facial droop, extraocular movements intact, tongue midline ) or sensory deficit. She exhibits normal muscle tone. She displays no seizure activity. Coordination normal.  pronator drift left upper extrem, decreased grip strength left hand, able to hold both legs off bed for 5 seconds, sensation intact in all extremities, no visual field cuts, no left or right sided neglect, normal finger-nose exam bilaterally, no nystagmus noted   Skin: Skin is warm and dry. No rash noted.  Psychiatric: She has a normal mood and affect.    ED Course  Procedures (including critical care time) Labs Review Labs Reviewed  CBC - Abnormal; Notable for the following:    RDW 15.9 (*)    All other components within normal limits  COMPREHENSIVE METABOLIC PANEL - Abnormal; Notable for the following:    Sodium 134 (*)    Glucose, Bld 112 (*)    Creatinine, Ser 1.18 (*)    GFR calc non Af Amer 42 (*)    GFR calc Af Amer 48 (*)    All other components within normal limits  I-STAT CHEM 8, ED - Abnormal; Notable for the following:    Sodium 135 (*)    Potassium 3.6 (*)    Creatinine, Ser 1.20 (*)    Glucose, Bld 112 (*)    All other  components within normal limits  ETHANOL  PROTIME-INR  APTT  DIFFERENTIAL  URINE RAPID DRUG SCREEN (HOSP PERFORMED)  URINALYSIS, ROUTINE W REFLEX MICROSCOPIC  I-STAT TROPOININ, ED  I-STAT TROPOININ, ED    Imaging Review Ct Head Wo Contrast  07/27/2014   CLINICAL DATA:  Left arm pain.  Weakness.  EXAM: CT HEAD WITHOUT CONTRAST  TECHNIQUE: Contiguous axial images were obtained from the base of the skull through the vertex without intravenous contrast.  COMPARISON:  CT of the head performed 07/11/2014, and MRI of the brain from 07/14/2014  FINDINGS: There is no evidence  of acute infarction, mass lesion, or intra- or extra-axial hemorrhage on CT.  Prominence of the ventricles and sulci reflects mild cortical volume loss. Cerebellar atrophy is noted.  The brainstem and fourth ventricle are within normal limits. The basal ganglia are unremarkable in appearance. The cerebral hemispheres demonstrate grossly normal gray-white differentiation. No mass effect or midline shift is seen.  There is no evidence of fracture; visualized osseous structures are unremarkable in appearance. The orbits are within normal limits. The paranasal sinuses and mastoid air cells are well-aerated. No significant soft tissue abnormalities are seen.  IMPRESSION: 1. No acute intracranial pathology seen on CT. 2. Mild cortical volume loss noted.   Electronically Signed   By: Roanna Raider M.D.   On: 07/27/2014 04:16   Dg Chest Portable 1 View  07/27/2014   CLINICAL DATA:  Left arm pain and weakness.  EXAM: PORTABLE CHEST - 1 VIEW  COMPARISON:  Chest radiograph performed 07/11/2014  FINDINGS: The lungs are well expanded. Vascular congestion is noted, with mildly increased interstitial markings, possibly reflecting minimal interstitial edema. No pleural effusion or pneumothorax is seen.  The cardiomediastinal silhouette is borderline normal in size. No acute osseous abnormalities are identified.  IMPRESSION: Vascular congestion, with  mildly increased interstitial markings, possibly reflecting minimal interstitial edema.   Electronically Signed   By: Roanna Raider M.D.   On: 07/27/2014 04:13     EKG Interpretation   Date/Time:  Monday July 27 2014 03:03:23 EDT Ventricular Rate:  76 PR Interval:  149 QRS Duration: 138 QT Interval:  407 QTC Calculation: 458 R Axis:   -29 Text Interpretation:  Sinus rhythm Right bundle branch block Inferior  infarct, age indeterminate No significant change since last tracing  Confirmed by Holleigh Crihfield  MD-J, Estiben Mizuno ([id number]) on 07/27/2014 3:16:25 AM      MDM  [id number]  Pt is not a tpa candidate.  Last known normal was around 9 to 9:30 pm.   Pt does have arm weakness on exam.  States this is new since her recent hospitalization.  Will evaluate for acute coronary syndrome considering her recent mi but symptoms at this time more suggestive of neurologic etiology.    Final diagnoses:  Stroke    Pt has persistent left arm weakness.   Concerning for stroke.  Recent MRI showed punctate strokes in the right frontal and parietal region, right MCA territory.  Exam today is consistent with recurrent issues in that region.  Will admit for further treatment.   MRI not available at this time.     Md Linwood Dibbles, MD 07/27/14 [phone]

## 2014-07-27 NOTE — H&P (Signed)
PCP:   [name],[name], MD   Chief Complaint:  Left arm weakness  HPI:  80 year old female who  has a past medical history of Hiatal hernia; Hypercholesterolemia; Fractured tibia; Hip pain; PE (pulmonary embolism); Complication of anesthesia; PONV (postoperative nausea and vomiting); Myocardial infarct; Stroke; and Coronary artery disease. Patient presented with numbness and tingling of the left arm on 07/11/2014, at that time patient also had chest pain and STEMI underwent cardiac cath which showed 99% blockage at RCA which was stented with drug eluding stent, at the same time MRI brain showed embolic right brain infarcts thought to be likely due to acute MI. Patient was started on aspirin for stroke prevention and ticargrelor for STEMI.  Today patient presents with worsening weakness of the left arm along with numbness and tingling. The symptoms started last night, around 9:30 PM. She denies slurred speech, no blurry vision, did not pass out, no seizure-like activity. No chest pain or shortness of breath. Patient has been taking her medications as prescribed. She continues to have numbness tingling and weakness of the left arm. Patient has chronic left lower extremity pain after tibial fracture. She takes tramadol  50 mg 2 tablets 5 times a day.  Allergies:   Allergies  Allergen Reactions  . Baycol [Cerivastatin]   . Ciprofloxacin   . Decadron [Dexamethasone]   . Imipramine   . Oxycodone   . Premarin [Conjugated Estrogens]   . Synthroid [Levothyroxine]       Past Medical History  Diagnosis Date  . Hiatal hernia   . Hypercholesterolemia   . Fractured tibia   . Hip pain   . PE (pulmonary embolism)   . Complication of anesthesia   . PONV (postoperative nausea and vomiting)   . Myocardial infarct   . Stroke   . Coronary artery disease     Past Surgical History  Procedure Laterality Date  . Abdominal hysterectomy    . Leg surgery    . Esophagogastroduodenoscopy (egd) with  esophageal dilation  12/04/2012    Procedure: ESOPHAGOGASTRODUODENOSCOPY (EGD) WITH ESOPHAGEAL DILATION;  Surgeon: Malissa HippoNajeeb U Rehman, MD;  Location: [facility];  Service: Endoscopy;  Laterality: N/A;  255-rescheduled to 13:00 Ann to notify pt  . Fracture surgery      Left Knee  . Coronary stent placement      Prior to Admission medications   Medication Sig Start Date End Date Taking? Authorizing Provider  aspirin 81 MG tablet Take 1 tablet (81 mg total) by mouth daily. 07/14/14  Yes Rhonda G Barrett, PA-C  atorvastatin (LIPITOR) 10 MG tablet Take 1 tablet (10 mg total) by mouth daily at 6 PM. 07/14/14  Yes Rhonda G Barrett, PA-C  Calcium-Vitamin D (CALTRATE 600 PLUS-VIT D PO) Take 1 tablet by mouth daily.   Yes Historical Provider, MD  metoprolol succinate (TOPROL XL) 25 MG 24 hr tablet Take 1 tablet (25 mg total) by mouth daily. 07/14/14  Yes Rhonda G Barrett, PA-C  Omega-3 Fatty Acids (FISH OIL) 1200 MG CAPS Take 1,200 mg by mouth daily.   Yes Historical Provider, MD  OVER THE COUNTER MEDICATION Take 1 capsule by mouth daily. Flora 50 Billion units   Yes Historical Provider, MD  ramipril (ALTACE) 2.5 MG capsule Take 1 capsule (2.5 mg total) by mouth daily. 07/14/14  Yes Rhonda G Barrett, PA-C  ticagrelor (BRILINTA) 90 MG TABS tablet Take 1 tablet (90 mg total) by mouth 2 (two) times daily. 07/14/14  Yes Rhonda G Barrett, PA-C  traMADol (  ULTRAM) 50 MG tablet Take 50-100 mg by mouth every 6 (six) hours as needed for moderate pain or severe pain. Pain.   Yes Historical Provider, MD  vitamin B-12 (CYANOCOBALAMIN) 500 MCG tablet Take 500 mcg by mouth daily.   Yes Historical Provider, MD  Vitamin D, Ergocalciferol, (DRISDOL) 50000 UNITS CAPS Take 50,000 Units by mouth every 7 (seven) days.   Yes Historical Provider, MD    Social History:  reports that she has never smoked. She does not have any smokeless tobacco history on file. She reports that she does not drink alcohol or use illicit drugs.  No  family history on file.   All the positives are listed in BOLD  Review of Systems:  HEENT: Headache, blurred vision, runny nose, sore throat Neck: Hypothyroidism, hyperthyroidism,,lymphadenopathy Chest : Shortness of breath, history of COPD, Asthma Heart : Chest pain, history of coronary arterey disease GI:  Nausea, vomiting, diarrhea, constipation, GERD GU: Dysuria, urgency, frequency of urination, hematuria Neuro: Stroke, seizures, syncope Psych: Depression, anxiety, hallucinations   Physical Exam: Blood pressure 137/99, pulse 77, temperature 97.6 F (36.4 C), temperature source Oral, resp. rate 18, height 5\' 3"  (1.6 m), weight 87.091 kg (192 lb), SpO2 98.00%. Constitutional:   Patient is a well-developed and well-nourished female* in no acute distress and cooperative with exam. Head: Normocephalic and atraumatic Mouth: Mucus membranes moist Eyes: PERRL, EOMI, conjunctivae normal Neck: Supple, No Thyromegaly Cardiovascular: RRR, S1 normal, S2 normal Pulmonary/Chest: CTAB, no wheezes, rales, or rhonchi Abdominal: Soft. Non-tender, non-distended, bowel sounds are normal, no masses, organomegaly, or guarding present.  Neurological: A&O x3, Strenght is normal and symmetric in the right upper and lower extremity, strength is 3/5 in the left upper extremity, 5/ 5 in left lower extremity, DTRs are 2+ bilaterally, plantars reflexes are downgoing bilaterally, cranial nerve II-XII are grossly intact, no focal motor deficit, sensory reduced sensation in left arm. Extremities : No Cyanosis, Clubbing or Edema  Labs on Admission:  Basic Metabolic Panel:  Recent Labs Lab 07/27/14 0300 07/27/14 0333  NA 134* 135*  K 3.9 3.6*  CL 98 99  CO2 24  --   GLUCOSE 112* 112*  BUN 15 15  CREATININE 1.18* 1.20*  CALCIUM 9.6  --    Liver Function Tests:  Recent Labs Lab 07/27/14 0300  AST 15  ALT 8  ALKPHOS 76  BILITOT 0.4  PROT 6.8  ALBUMIN 3.5   No results found for this basename:  LIPASE, AMYLASE,  in the last 168 hours No results found for this basename: AMMONIA,  in the last 168 hours CBC:  Recent Labs Lab 07/27/14 0300 07/27/14 0333  WBC 10.4  --   NEUTROABS 7.2  --   HGB 12.9 13.6  HCT 37.9 40.0  MCV 88.1  --   PLT 234  --    Cardiac Enzymes: No results found for this basename: CKTOTAL, CKMB, CKMBINDEX, TROPONINI,  in the last 168 hours  BNP (last 3 results) No results found for this basename: PROBNP,  in the last 8760 hours CBG: No results found for this basename: GLUCAP,  in the last 168 hours  Radiological Exams on Admission: Ct Head Wo Contrast  07/27/2014   CLINICAL DATA:  Left arm pain.  Weakness.  EXAM: CT HEAD WITHOUT CONTRAST  TECHNIQUE: Contiguous axial images were obtained from the base of the skull through the vertex without intravenous contrast.  COMPARISON:  CT of the head performed 07/11/2014, and MRI of the brain from 07/14/2014  FINDINGS:  There is no evidence of acute infarction, mass lesion, or intra- or extra-axial hemorrhage on CT.  Prominence of the ventricles and sulci reflects mild cortical volume loss. Cerebellar atrophy is noted.  The brainstem and fourth ventricle are within normal limits. The basal ganglia are unremarkable in appearance. The cerebral hemispheres demonstrate grossly normal gray-white differentiation. No mass effect or midline shift is seen.  There is no evidence of fracture; visualized osseous structures are unremarkable in appearance. The orbits are within normal limits. The paranasal sinuses and mastoid air cells are well-aerated. No significant soft tissue abnormalities are seen.  IMPRESSION: 1. No acute intracranial pathology seen on CT. 2. Mild cortical volume loss noted.   Electronically Signed   By: Roanna Raider M.D.   On: 07/27/2014 04:16   Dg Chest Portable 1 View  07/27/2014   CLINICAL DATA:  Left arm pain and weakness.  EXAM: PORTABLE CHEST - 1 VIEW  COMPARISON:  Chest radiograph performed 07/11/2014   FINDINGS: The lungs are well expanded. Vascular congestion is noted, with mildly increased interstitial markings, possibly reflecting minimal interstitial edema. No pleural effusion or pneumothorax is seen.  The cardiomediastinal silhouette is borderline normal in size. No acute osseous abnormalities are identified.  IMPRESSION: Vascular congestion, with mildly increased interstitial markings, possibly reflecting minimal interstitial edema.   Electronically Signed   By: Roanna Raider M.D.   On: 07/27/2014 04:13    EKG: Independently reviewed. Right bundle branch block   Assessment/Plan Principal Problem:   Left arm weakness Active Problems:   CAD S/P percutaneous coronary angioplasty: Promus Premier DES   Presence of drug coated stent in right coronary artery   Stroke  Left arm weakness/? CVA Patient had left arm weakness when she was discharged from the hospital, and she was receiving physical therapy at home and was making good improvement. And now patient has worsening weakness of the left arm, which could be due to new CVA. In the previous admission patient had embolic shower in the region of the right middle cerebral artery, 2-D echo showed left atrial enlargement and akinesis of the basal mid inferolateral and inferior myocardium. I will obtain an MRI/MRA brain to rule out CVA, if again it shows new stroke. Patient may need transesophageal echocardiogram to rule out cardiac thrombus. We'll continue the patient on aspirin and Brillinta. I will hold metoprolol and ramipril to the stroke is ruled out for permissive hypertension  CAD status post drug eluding stent in the right coronary artery Patient denies chest pain, she has been taking aspirin and Brillinta as prescribed, will continue these medications. I will check serial cardiac enzymes to rule out acute coronary syndrome presenting as left arm weakness as stent thrombosis can occur with drug eluding stent. Metoprolol and ramipril on  hold, till the stroke is ruled out. Consider starting these medications if MRI shows no stroke.  DVT prophylaxis Lovenox Code status: Patient is full code  Family discussion: Admission, patients condition and plan of care including tests being ordered have been discussed with the patient and her daughter at bedside* who indicate understanding and agree with the plan and Code Status.   Time Spent on Admission: 75 minutes  Burch Marchuk S [facility] Pager: [phone] 07/27/2014, 6:04 AM  If 7PM-7AM, please contact night-coverage  [email or web address]  Password [email or web address]

## 2014-07-27 NOTE — Progress Notes (Signed)
PROGRESS NOTE  Alexandria Castillo ZOX:[id number] DOB: [birth year] DOA: 07/27/2014 PCP: Kathlee Nations, MD  Summary: 80 year old woman hospitalized less than 2 weeks ago with numbness and tingling of the left arm as well as chest pain. Underwent cath for STEMI, 99% RCA blockage treated with stent, at same time MRI brain revealed embolic right brain infarcts thought to be secondary to acute MI. She was started on aspirin.  She presented to [facility] 8/10 with complaints of left upper extremity weakness and paresthesias.  She was recently in the hospital the end of July. At that time she was admitted for trouble with arm weakness. She eventually had an MRI of the brain showing right frontal and parietal strokes. While in the hospital she developed acute pain in her arm and had an EKG showing ST elevation MI. She underwent cardiac catheterization and had a stent placed.  Assessment/Plan: 1. Left upper extremity weakness with pronator drift. Highly suspicious for recurrent stroke.  2. S/p embolic right brain infarcts 07/1190 thought secondary to acute MI 3. S/p STEMI with PCI/stent 06/2014. asymptomatic. No chest pain. Troponin negative. EKG without acute changes.   ASppears calm and comfortable with no evidence to suggest recurrent MI. Suspect recurrent stroke. Plan MRI, neurology followup. Consider TEE but defer to neurology recommendations.   Continue ASA and Brilinta  Keep outpatient followup with Dr. Roda Shutters at vascular neurologic clinic in 4 weeks  Code Status: full code DVT prophylaxis: Lovenox Family Communication: husband, son, daughter Disposition Plan: home  Brendia Sacks, MD  [facility]  Pager [phone] If 7PM-7AM, please contact night-coverage at [email or web address], password [facility] 07/27/2014, 9:17 AM  LOS: 0 days   Consultants:  Neurology  Procedures:    Antibiotics:    HPI/Subjective: No chest pain or SOB. Still has left upper extremity weakness. No new neurologic  deficits.  Objective: Filed Vitals:   07/27/14 0330 07/27/14 0355 07/27/14 0517 07/27/14 0621  BP: 144/73  137/99 128/66  Pulse: 73  77 83  Temp:  97.6 F (36.4 C)  97.9 F (36.6 C)  TempSrc:    Oral  Resp: 15  18 17   Height:      Weight:    87.6 kg (193 lb 2 oz)  SpO2: 95%  98% 97%   No intake or output data in the 24 hours ending 07/27/14 0917   Filed Weights   07/27/14 0305 07/27/14 0621  Weight: 87.091 kg (192 lb) 87.6 kg (193 lb 2 oz)    Exam:     AF VSS Gen. Appears calm and comfortable  Psych. Speech fluent and clear  Cardiovascular. Regular rate and rhythm. Telemetry sinus rhythm. No murmur, rub gallop. Plus bilateral lower extremity edema.  Respiratory. Clear to auscultation bilaterally. No wheezes, rales or rhonchi. Normal respiratory effort.  Musculoskeletal. Bilateral lower extremity strength appears grossly normal and symmetric. Right upper extremity strength appears grossly normal. The left upper extremity significantly weak, unable to raise arm more than a foot or so of the bed. Very weak grip. Unable to touch her nose.  Neurologic. Cranial nerves appear intact.  Data Reviewed:  Chemistry: K+ 3.6  Other: EKG sinus rhythm no murmurs block.  Imaging: CT head negative  Scheduled Meds: . antiseptic oral rinse  7 mL Mouth Rinse BID  . aspirin EC  81 mg Oral Daily  . atorvastatin  10 mg Oral q1800  . enoxaparin (LOVENOX) injection  40 mg Subcutaneous Q24H  . sodium chloride  3 mL Intravenous Q12H  . sodium chloride  3  mL Intravenous Q12H  . ticagrelor  90 mg Oral BID   Continuous Infusions:   Principal Problem:   Left arm weakness Active Problems:   CAD S/P percutaneous coronary angioplasty: Promus Premier DES   Presence of drug coated stent in right coronary artery   Stroke   Time spent 20 minutes

## 2014-07-27 NOTE — Consult Note (Signed)
[place] A. Merlene Laughter, MD     [email or web address]          Alexandria Castillo is an 80 y.o. female.   ASSESSMENT/PLAN: 1. Multiple stroke involving the right hemisphere in 2 or 3 vascular distribution. The phenomenon is worrisome for cardioembolic etiology. Consequent, a four-week event monitor/Holter monitor will be obtained. The transesophageal echocardiography will also be obtained. The patient is currently on maximal medical treatment and therefore I'll continue with this ( Dual antiplatelet agent and a cholesterol medication). A carotid duplex Doppler will be obtained. Consider discontinuing the Patient's Nonsteroidal anti-inflammatory Medication as this can be associated with thromboembolic phenomena.   This 80 year old white female who was recently admitted to the hospital after she developed weakness and numbness of the left upper extremity. She developed chest pain and was evaluated for this. She tended to have had a ST segment elevated MI. She was found to have severe RCA stenosis and did undergo a single stent placement. She is on fairly well with this although she's had some episodic dyspnea after she was sent home. She reports having resolution of the left-sided/left upper extremity numbness and weakness. The neurological symptoms lasted for a few hours and subsequently resolved. She woke up yesterday about 2:00 in the morning with returning left upper extremity numbness and weakness. This time his symptoms have persisted with limited improvement. She does not report report having returning chest pain. The legs are not involved. She complains of a lot of pain involving the legs and hips. This is a chronic ongoing issue. No visual symptoms are reported. No headaches, visual symptoms, dysarthria or dysphasia as reported. The review of systems otherwise negative.  GENERAL: This very pleasant overweight female in no acute distress.  HEENT: Supple. Atraumatic normocephalic.  There is hearing impairment bilaterally.  ABDOMEN: soft  EXTREMITIES: No edema. There is arthritic changes noted of the upper and lower extremities.   BACK: Normal.  SKIN: Normal by inspection.    MENTAL STATUS: Alert and oriented. Speech, language and cognition are generally intact. Judgment and insight normal.   CRANIAL NERVES: Pupils are equal, round and reactive to light and accommodation; extra ocular movements are full, there is no significant nystagmus; visual fields are full; upper and lower facial muscles are normal in strength and symmetric, there is no flattening of the nasolabial folds; tongue is midline; uvula is midline; shoulder elevation is normal. Visual fields are intact.  MOTOR: Proximal left upper extremity 3/5 but hand movement 0/5. The other extremities shows normal tone, bulk and strength  COORDINATION: Left finger to nose is normal, right finger to nose is normal, No rest tremor; no intention tremor; no postural tremor; no bradykinesia.  REFLEXES: Deep tendon reflexes are symmetrical and normal. Babinski reflexes are Extensor bilaterally.   SENSATION: Reduced to light touch involving the left upper extremity.   The patient's brain MRI is reviewed and compared to the previous scan. There are new or increased signal seen on diffusion scan involving the temporal parietal lobe. There is also single lesion noted in the occipital lobe. These are all on the right side. These are multiple lesions in at least 2 vascular distribution. The MCA and PCA distribution on the right side. It appears to be extension of the previous strokes with resolution of some areas. No significant large vessel disease is noted.   Past Medical History  Diagnosis Date  . Hiatal hernia   . Hypercholesterolemia   . Fractured tibia   . Hip pain   .  PE (pulmonary embolism)   . Complication of anesthesia   . PONV (postoperative nausea and vomiting)   . Myocardial infarct   . Stroke   .  Coronary artery disease     Past Surgical History  Procedure Laterality Date  . Abdominal hysterectomy    . Leg surgery    . Esophagogastroduodenoscopy (egd) with esophageal dilation  12/04/2012    Procedure: ESOPHAGOGASTRODUODENOSCOPY (EGD) WITH ESOPHAGEAL DILATION;  Surgeon: Rogene Houston, MD;  Location: [facility];  Service: Endoscopy;  Laterality: N/A;  255-rescheduled to 13:00 Ann to notify pt  . Fracture surgery      Left Knee  . Coronary stent placement      No family history on file.  Social History:  reports that she has never smoked. She does not have any smokeless tobacco history on file. She reports that she does not drink alcohol or use illicit drugs.  Allergies:  Allergies  Allergen Reactions  . Ciprofloxacin Other (See Comments)    unknown  . Decadron [Dexamethasone] Other (See Comments)    unknown  . Imipramine Other (See Comments)    Patient states "I felt awful"  . Oxycodone Other (See Comments)    unknown  . Premarin [Conjugated Estrogens] Other (See Comments)    unknown  . Synthroid [Levothyroxine] Other (See Comments)    unknown  . Baycol [Cerivastatin] Rash    Medications: Prior to Admission medications   Medication Sig Start Date End Date Taking? Authorizing Provider  aspirin 81 MG tablet Take 1 tablet (81 mg total) by mouth daily. 07/14/14  Yes Rhonda G Barrett, PA-C  atorvastatin (LIPITOR) 10 MG tablet Take 1 tablet (10 mg total) by mouth daily at 6 PM. 07/14/14  Yes Rhonda G Barrett, PA-C  Calcium-Vitamin D (CALTRATE 600 PLUS-VIT D PO) Take 1 tablet by mouth daily.   Yes Historical Provider, MD  cefdinir (OMNICEF) 300 MG capsule Take 300 mg by mouth 2 (two) times daily. For 7 days. 07/20/14 07/27/14 Yes Historical Provider, MD  meloxicam (MOBIC) 7.5 MG tablet Take 7.5 mg by mouth daily.   Yes Historical Provider, MD  metoprolol succinate (TOPROL-XL) 25 MG 24 hr tablet Take 12.5 mg by mouth daily.   Yes Historical Provider, MD  nystatin cream  (MYCOSTATIN) Apply 1 application topically daily as needed (rash).   Yes Historical Provider, MD  Omega-3 Fatty Acids (FISH OIL) 1200 MG CAPS Take 1,200 mg by mouth daily.   Yes Historical Provider, MD  ramipril (ALTACE) 2.5 MG capsule Take 1 capsule (2.5 mg total) by mouth daily. 07/14/14  Yes Rhonda G Barrett, PA-C  ticagrelor (BRILINTA) 90 MG TABS tablet Take 1 tablet (90 mg total) by mouth 2 (two) times daily. 07/14/14  Yes Rhonda G Barrett, PA-C  traMADol (ULTRAM) 50 MG tablet Take 50-100 mg by mouth every 6 (six) hours as needed for moderate pain or severe pain. Pain.   Yes Historical Provider, MD  vitamin B-12 (CYANOCOBALAMIN) 500 MCG tablet Take 500 mcg by mouth daily.   Yes Historical Provider, MD  Vitamin D, Ergocalciferol, (DRISDOL) 50000 UNITS CAPS Take 50,000 Units by mouth every 7 (seven) days. Take on Monday.   Yes Historical Provider, MD    Scheduled Meds: . antiseptic oral rinse  7 mL Mouth Rinse BID  . aspirin EC  81 mg Oral Daily  . atorvastatin  10 mg Oral q1800  . enoxaparin (LOVENOX) injection  40 mg Subcutaneous Q24H  . sodium chloride  3 mL Intravenous Q12H  .  sodium chloride  3 mL Intravenous Q12H  . ticagrelor  90 mg Oral BID   Continuous Infusions:  PRN Meds:.sodium chloride, acetaminophen, acetaminophen, HYDROcodone-acetaminophen, ondansetron (ZOFRAN) IV, ondansetron, sodium chloride, traMADol   Blood pressure 127/55, pulse 72, temperature 98.2 F (36.8 C), temperature source Oral, resp. rate 18, height 5' 3"  (1.6 m), weight 87.6 kg (193 lb 2 oz), SpO2 96.00%.   Results for orders placed during the hospital encounter of 07/27/14 (from the past 48 hour(s))  ETHANOL     Status: None   Collection Time    07/27/14  3:00 AM      Result Value Ref Range   Alcohol, Ethyl (B) <11  0 - 11 mg/dL   Comment:            LOWEST DETECTABLE LIMIT FOR     SERUM ALCOHOL IS 11 mg/dL     FOR MEDICAL PURPOSES ONLY  PROTIME-INR     Status: None   Collection Time    07/27/14   3:00 AM      Result Value Ref Range   Prothrombin Time 12.5  11.6 - 15.2 seconds   INR 0.93  0.00 - 1.49  APTT     Status: None   Collection Time    07/27/14  3:00 AM      Result Value Ref Range   aPTT 30  24 - 37 seconds  CBC     Status: Abnormal   Collection Time    07/27/14  3:00 AM      Result Value Ref Range   WBC 10.4  4.0 - 10.5 K/uL   RBC 4.30  3.87 - 5.11 MIL/uL   Hemoglobin 12.9  12.0 - 15.0 g/dL   HCT 37.9  36.0 - 46.0 %   MCV 88.1  78.0 - 100.0 fL   MCH 30.0  26.0 - 34.0 pg   MCHC 34.0  30.0 - 36.0 g/dL   RDW 15.9 (*) 11.5 - 15.5 %   Platelets 234  150 - 400 K/uL  DIFFERENTIAL     Status: None   Collection Time    07/27/14  3:00 AM      Result Value Ref Range   Neutrophils Relative % 70  43 - 77 %   Neutro Abs 7.2  1.7 - 7.7 K/uL   Lymphocytes Relative 18  12 - 46 %   Lymphs Abs 1.9  0.7 - 4.0 K/uL   Monocytes Relative 8  3 - 12 %   Monocytes Absolute 0.8  0.1 - 1.0 K/uL   Eosinophils Relative 4  0 - 5 %   Eosinophils Absolute 0.4  0.0 - 0.7 K/uL   Basophils Relative 0  0 - 1 %   Basophils Absolute 0.0  0.0 - 0.1 K/uL  COMPREHENSIVE METABOLIC PANEL     Status: Abnormal   Collection Time    07/27/14  3:00 AM      Result Value Ref Range   Sodium 134 (*) 137 - 147 mEq/L   Potassium 3.9  3.7 - 5.3 mEq/L   Chloride 98  96 - 112 mEq/L   CO2 24  19 - 32 mEq/L   Glucose, Bld 112 (*) 70 - 99 mg/dL   BUN 15  6 - 23 mg/dL   Creatinine, Ser 1.18 (*) 0.50 - 1.10 mg/dL   Calcium 9.6  8.4 - 10.5 mg/dL   Total Protein 6.8  6.0 - 8.3 g/dL   Albumin 3.5  3.5 - 5.2  g/dL   AST 15  0 - 37 U/L   ALT 8  0 - 35 U/L   Alkaline Phosphatase 76  39 - 117 U/L   Total Bilirubin 0.4  0.3 - 1.2 mg/dL   GFR calc non Af Amer 42 (*) >90 mL/min   GFR calc Af Amer 48 (*) >90 mL/min   Comment: (NOTE)     The eGFR has been calculated using the CKD EPI equation.     This calculation has not been validated in all clinical situations.     eGFR's persistently <90 mL/min signify possible  Chronic Kidney     Disease.   Anion gap 12  5 - 15  I-STAT CHEM 8, ED     Status: Abnormal   Collection Time    07/27/14  3:33 AM      Result Value Ref Range   Sodium 135 (*) 137 - 147 mEq/L   Potassium 3.6 (*) 3.7 - 5.3 mEq/L   Chloride 99  96 - 112 mEq/L   BUN 15  6 - 23 mg/dL   Creatinine, Ser 1.20 (*) 0.50 - 1.10 mg/dL   Glucose, Bld 112 (*) 70 - 99 mg/dL   Calcium, Ion 1.25  1.13 - 1.30 mmol/L   TCO2 22  0 - 100 mmol/L   Hemoglobin 13.6  12.0 - 15.0 g/dL   HCT 40.0  36.0 - 46.0 %  I-STAT TROPOININ, ED     Status: None   Collection Time    07/27/14  3:33 AM      Result Value Ref Range   Troponin i, poc 0.06  0.00 - 0.08 ng/mL   Comment 3            Comment: Due to the release kinetics of cTnI,     a negative result within the first hours     of the onset of symptoms does not rule out     myocardial infarction with certainty.     If myocardial infarction is still suspected,     repeat the test at appropriate intervals.  URINE RAPID DRUG SCREEN (HOSP PERFORMED)     Status: None   Collection Time    07/27/14  4:30 AM      Result Value Ref Range   Opiates NONE DETECTED  NONE DETECTED   Cocaine NONE DETECTED  NONE DETECTED   Benzodiazepines NONE DETECTED  NONE DETECTED   Amphetamines NONE DETECTED  NONE DETECTED   Tetrahydrocannabinol NONE DETECTED  NONE DETECTED   Barbiturates NONE DETECTED  NONE DETECTED   Comment:            DRUG SCREEN FOR MEDICAL PURPOSES     ONLY.  IF CONFIRMATION IS NEEDED     FOR ANY PURPOSE, NOTIFY LAB     WITHIN 5 DAYS.                LOWEST DETECTABLE LIMITS     FOR URINE DRUG SCREEN     Drug Class       Cutoff (ng/mL)     Amphetamine      1000     Barbiturate      200     Benzodiazepine   426     Tricyclics       834     Opiates          300     Cocaine          300  THC              50  URINALYSIS, ROUTINE W REFLEX MICROSCOPIC     Status: Abnormal   Collection Time    07/27/14  4:30 AM      Result Value Ref Range   Color,  Urine YELLOW  YELLOW   APPearance CLEAR  CLEAR   Specific Gravity, Urine <1.005 (*) 1.005 - 1.030   pH 6.0  5.0 - 8.0   Glucose, UA NEGATIVE  NEGATIVE mg/dL   Hgb urine dipstick TRACE (*) NEGATIVE   Bilirubin Urine NEGATIVE  NEGATIVE   Ketones, ur NEGATIVE  NEGATIVE mg/dL   Protein, ur NEGATIVE  NEGATIVE mg/dL   Urobilinogen, UA 0.2  0.0 - 1.0 mg/dL   Nitrite NEGATIVE  NEGATIVE   Leukocytes, UA NEGATIVE  NEGATIVE  URINE MICROSCOPIC-ADD ON     Status: None   Collection Time    07/27/14  4:30 AM      Result Value Ref Range   Squamous Epithelial / LPF RARE  RARE   WBC, UA 0-2  <3 WBC/hpf   RBC / HPF 0-2  <3 RBC/hpf   Bacteria, UA RARE  RARE  TROPONIN I     Status: None   Collection Time    07/27/14  6:58 AM      Result Value Ref Range   Troponin I <0.30  <0.30 ng/mL   Comment:            Due to the release kinetics of cTnI,     a negative result within the first hours     of the onset of symptoms does not rule out     myocardial infarction with certainty.     If myocardial infarction is still suspected,     repeat the test at appropriate intervals.  TROPONIN I     Status: None   Collection Time    07/27/14 12:07 PM      Result Value Ref Range   Troponin I <0.30  <0.30 ng/mL   Comment:            Due to the release kinetics of cTnI,     a negative result within the first hours     of the onset of symptoms does not rule out     myocardial infarction with certainty.     If myocardial infarction is still suspected,     repeat the test at appropriate intervals.  TROPONIN I     Status: None   Collection Time    07/27/14  6:09 PM      Result Value Ref Range   Troponin I <0.30  <0.30 ng/mL   Comment:            Due to the release kinetics of cTnI,     a negative result within the first hours     of the onset of symptoms does not rule out     myocardial infarction with certainty.     If myocardial infarction is still suspected,     repeat the test at appropriate intervals.      Ct Head Wo Contrast  07/27/2014   CLINICAL DATA:  Left arm pain.  Weakness.  EXAM: CT HEAD WITHOUT CONTRAST  TECHNIQUE: Contiguous axial images were obtained from the base of the skull through the vertex without intravenous contrast.  COMPARISON:  CT of the head performed 07/11/2014, and MRI of the brain from 07/14/2014  FINDINGS: There is no  evidence of acute infarction, mass lesion, or intra- or extra-axial hemorrhage on CT.  Prominence of the ventricles and sulci reflects mild cortical volume loss. Cerebellar atrophy is noted.  The brainstem and fourth ventricle are within normal limits. The basal ganglia are unremarkable in appearance. The cerebral hemispheres demonstrate grossly normal gray-white differentiation. No mass effect or midline shift is seen.  There is no evidence of fracture; visualized osseous structures are unremarkable in appearance. The orbits are within normal limits. The paranasal sinuses and mastoid air cells are well-aerated. No significant soft tissue abnormalities are seen.  IMPRESSION: 1. No acute intracranial pathology seen on CT. 2. Mild cortical volume loss noted.   Electronically Signed   By: Garald Balding M.D.   On: 07/27/2014 04:16   Mr Jodene Nam Head Wo Contrast  07/27/2014   CLINICAL DATA:  CVA.  EXAM: MRA HEAD WITHOUT CONTRAST  TECHNIQUE: Angiographic images of the Circle of Willis were obtained using MRA technique without intravenous contrast.  COMPARISON:  MRI brain from the same day. MRA circle of Willis 07/14/2014.  FINDINGS: The cervical right internal carotid artery is tortuous. Mild atherosclerotic changes are again noted within the cavernous carotid arteries without significant stenosis. There is focal signal loss in the distal right A1 segment which may be artifactual. The anterior communicating artery is patent. The upper portion of the study is degraded by patient motion, exaggerating small vessel disease. There is asymmetric signal loss in the proximal right A2  segment. The MCA bifurcations are intact. There is significant scattered small vessel attenuation bilaterally.  The vertebral arteries are codominant. The right PICA origin is visualized and normal. The left AICA is dominant. The basilar artery is within normal limits. There is mild signal loss in the proximal right posterior cerebral artery. Moderate signal loss is noted in the PCA branch vessels bilaterally.  IMPRESSION: 1. Mild atherosclerotic changes within the cavernous carotid arteries bilaterally. No significant proximal lesion to account for the right MCA territory infarcts. 2. No significant proximal stenosis, aneurysm, or branch vessel occlusion. 3. Signal loss the proximal right A2 segment and right P2 segment may be exaggerated by patient motion. 4. Small branch vessel attenuation is exaggerated diffusely compared to the prior study, likely due to patient motion.   Electronically Signed   By: Lawrence Santiago M.D.   On: 07/27/2014 11:27   Mr Brain Wo Contrast  07/27/2014   CLINICAL DATA:  Left arm pain and weakness.  CVA.  EXAM: MRI HEAD WITHOUT CONTRAST  TECHNIQUE: Multiplanar, multiecho pulse sequences of the brain and surrounding structures were obtained without intravenous contrast.  COMPARISON:  CT head without contrast 07/27/2014. MRI brain without contrast 07/14/2014.  FINDINGS: The diffusion-weighted images demonstrate progression of restricted diffusion an acute nonhemorrhagic infarct within the posterior right frontal lobe along the prior motor cortex and within the subjacent white matter. A 5 mm linear area of acute infarction is noted within the right occipital lobe, new from the prior exam. Additional more anterior right frontal lobe infarcts are no longer visible on the diffusion sequence.  T2 changes are associated with the progressive right MCA territory infarcts. Periventricular and brainstem white matter changes are stable.  No hemorrhage or mass lesion is present. The ventricles are  of normal size. No significant extraaxial fluid collection is present.  Flow is present in the major intracranial arteries. The globes and orbits are intact. Paranasal sinuses and mastoid air cells are clear. A relatively empty sella is stable. The craniocervical junction is within  normal limits.  IMPRESSION: 1. Progressive right MCA territory infarcts involving the posterior right frontal lobe and anterior right parietal lobe without hemorrhage. 2. New 5 mm acute nonhemorrhagic infarct within the right occipital lobe. 3. Minimal periventricular white matter changes are otherwise stable.   Electronically Signed   By: Lawrence Santiago M.D.   On: 07/27/2014 11:24   Dg Chest Portable 1 View  07/27/2014   CLINICAL DATA:  Left arm pain and weakness.  EXAM: PORTABLE CHEST - 1 VIEW  COMPARISON:  Chest radiograph performed 07/11/2014  FINDINGS: The lungs are well expanded. Vascular congestion is noted, with mildly increased interstitial markings, possibly reflecting minimal interstitial edema. No pleural effusion or pneumothorax is seen.  The cardiomediastinal silhouette is borderline normal in size. No acute osseous abnormalities are identified.  IMPRESSION: Vascular congestion, with mildly increased interstitial markings, possibly reflecting minimal interstitial edema.   Electronically Signed   By: Garald Balding M.D.   On: 07/27/2014 04:13     TTE 06-2014 - Normal LV wall thickness with LVEF 50-55%, mid to basal inferolateral akinesis, grade 2 diastolic dysfunction. Moderate left atrial enlargement. Mild mitral regurgitation. Unable to assess PASP.     Kosei Rhodes A. Merlene Laughter, M.D.  Diplomate, Tax adviser of Psychiatry and Neurology ( Neurology). 07/27/2014, 6:53 PM

## 2014-07-27 NOTE — Plan of Care (Signed)
Problem: Progression Outcomes Goal: Communication method established Outcome: Completed/Met Date Met:  07/27/14 Patient able to speak clearly and understand responses.

## 2014-07-27 NOTE — ED Notes (Addendum)
[facility] with Dr[facility] Roselyn [facility]eringJ. [facility] Patient was last seen normal 6 hours ago per doctor.

## 2014-07-27 NOTE — Progress Notes (Signed)
Radiology department called regarding multiple infarctions identified through MRI. Dr. Irene LimboGoodrich notified.

## 2014-07-28 ENCOUNTER — Inpatient Hospital Stay (HOSPITAL_COMMUNITY): Payer: Medicare PPO

## 2014-07-28 LAB — GLUCOSE, CAPILLARY: GLUCOSE-CAPILLARY: 92 mg/dL (ref 70–99)

## 2014-07-28 MED ORDER — TEMAZEPAM 15 MG PO CAPS
15.0000 mg | ORAL_CAPSULE | Freq: Every evening | ORAL | Status: DC | PRN
  Administered 2014-07-28 – 2014-07-29 (×2): 15 mg via ORAL
  Filled 2014-07-28 (×2): qty 1

## 2014-07-28 NOTE — Progress Notes (Signed)
Patient ID: Alexandria Castillo, female   DOB: [birth year], 80 y.o.   MRN: [id number]  [place] A. Merlene Laughter, MD     [email or web address]          Alexandria Castillo is an 80 y.o. female.   Assessment/Plan: 1. Multiple stroke involving the right hemisphere in 2 or 3 vascular distribution. The phenomenon is worrisome for cardioembolic etiology. Consequent, a four-week event monitor/Holter monitor will be obtained. The transesophageal echocardiography will also be obtained. This is due to be done tomorrow. She will be made nothing by mouth. The patient is currently on maximal medical treatment and therefore I'll continue with this ( Dual antiplatelet agent and a cholesterol medication). A carotid duplex Doppler will be obtained. Consider discontinuing the Patient's Nonsteroidal anti-inflammatory Medication as this can be associated with thromboembolic phenomena.   She complains of having Periorbital numbness and numbness on the left side of the tongue. This appears to be a new finding. The left upper extremity is unchanged. No blurry vision as reported. No dysarthria.    GENERAL: This very pleasant overweight female in no acute distress.  HEENT: Supple. Atraumatic normocephalic. There is hearing impairment bilaterally.  ABDOMEN: soft  EXTREMITIES: No edema. There is arthritic changes noted of the upper and lower extremities.  BACK: Normal.  SKIN: Normal by inspection.  MENTAL STATUS: Alert and oriented. Speech, language and cognition are generally intact. Judgment and insight normal.  CRANIAL NERVES: Pupils are equal, round and reactive to light and accommodation; extra ocular movements are full, there is no significant nystagmus; visual fields are full; upper and lower facial muscles are normal in strength and symmetric, there is no flattening of the nasolabial folds; tongue is midline; uvula is midline; shoulder elevation is normal. Visual fields are intact.  MOTOR: Proximal left upper  extremity 3/5 but hand movement 0/5. The other extremities shows normal tone, bulk and strength  COORDINATION: Left finger to nose is normal, right finger to nose is normal, No rest tremor; no intention tremor; no postural tremor; no bradykinesia.  REFLEXES: Deep tendon reflexes are symmetrical and normal. Babinski reflexes are Extensor bilaterally.  SENSATION: Reduced to light touch involving the left upper extremity.        Objective: Vital signs in last 24 hours: Temp:  [98 F (36.7 C)-98.1 F (36.7 C)] 98.1 F (36.7 C) (08/11 0430) Pulse Rate:  [67-82] 74 (08/11 0430) Resp:  [18-20] 18 (08/11 0430) BP: (117-143)/(54-80) 131/54 mmHg (08/11 0430) SpO2:  [94 %-97 %] 96 % (08/11 0430)  Intake/Output from previous day: 08/10 0701 - 08/11 0700 In: 490 [P.O.:490] Out: -  Intake/Output this shift:   Nutritional status: Cardiac   Lab Results: Results for orders placed during the hospital encounter of 07/27/14 (from the past 48 hour(s))  ETHANOL     Status: None   Collection Time    07/27/14  3:00 AM      Result Value Ref Range   Alcohol, Ethyl (B) <11  0 - 11 mg/dL   Comment:            LOWEST DETECTABLE LIMIT FOR     SERUM ALCOHOL IS 11 mg/dL     FOR MEDICAL PURPOSES ONLY  PROTIME-INR     Status: None   Collection Time    07/27/14  3:00 AM      Result Value Ref Range   Prothrombin Time 12.5  11.6 - 15.2 seconds   INR 0.93  0.00 - 1.49  APTT  Status: None   Collection Time    07/27/14  3:00 AM      Result Value Ref Range   aPTT 30  24 - 37 seconds  CBC     Status: Abnormal   Collection Time    07/27/14  3:00 AM      Result Value Ref Range   WBC 10.4  4.0 - 10.5 K/uL   RBC 4.30  3.87 - 5.11 MIL/uL   Hemoglobin 12.9  12.0 - 15.0 g/dL   HCT 37.9  36.0 - 46.0 %   MCV 88.1  78.0 - 100.0 fL   MCH 30.0  26.0 - 34.0 pg   MCHC 34.0  30.0 - 36.0 g/dL   RDW 15.9 (*) 11.5 - 15.5 %   Platelets 234  150 - 400 K/uL  DIFFERENTIAL     Status: None   Collection Time     07/27/14  3:00 AM      Result Value Ref Range   Neutrophils Relative % 70  43 - 77 %   Neutro Abs 7.2  1.7 - 7.7 K/uL   Lymphocytes Relative 18  12 - 46 %   Lymphs Abs 1.9  0.7 - 4.0 K/uL   Monocytes Relative 8  3 - 12 %   Monocytes Absolute 0.8  0.1 - 1.0 K/uL   Eosinophils Relative 4  0 - 5 %   Eosinophils Absolute 0.4  0.0 - 0.7 K/uL   Basophils Relative 0  0 - 1 %   Basophils Absolute 0.0  0.0 - 0.1 K/uL  COMPREHENSIVE METABOLIC PANEL     Status: Abnormal   Collection Time    07/27/14  3:00 AM      Result Value Ref Range   Sodium 134 (*) 137 - 147 mEq/L   Potassium 3.9  3.7 - 5.3 mEq/L   Chloride 98  96 - 112 mEq/L   CO2 24  19 - 32 mEq/L   Glucose, Bld 112 (*) 70 - 99 mg/dL   BUN 15  6 - 23 mg/dL   Creatinine, Ser 1.18 (*) 0.50 - 1.10 mg/dL   Calcium 9.6  8.4 - 10.5 mg/dL   Total Protein 6.8  6.0 - 8.3 g/dL   Albumin 3.5  3.5 - 5.2 g/dL   AST 15  0 - 37 U/L   ALT 8  0 - 35 U/L   Alkaline Phosphatase 76  39 - 117 U/L   Total Bilirubin 0.4  0.3 - 1.2 mg/dL   GFR calc non Af Amer 42 (*) >90 mL/min   GFR calc Af Amer 48 (*) >90 mL/min   Comment: (NOTE)     The eGFR has been calculated using the CKD EPI equation.     This calculation has not been validated in all clinical situations.     eGFR's persistently <90 mL/min signify possible Chronic Kidney     Disease.   Anion gap 12  5 - 15  I-STAT CHEM 8, ED     Status: Abnormal   Collection Time    07/27/14  3:33 AM      Result Value Ref Range   Sodium 135 (*) 137 - 147 mEq/L   Potassium 3.6 (*) 3.7 - 5.3 mEq/L   Chloride 99  96 - 112 mEq/L   BUN 15  6 - 23 mg/dL   Creatinine, Ser 1.20 (*) 0.50 - 1.10 mg/dL   Glucose, Bld 112 (*) 70 - 99 mg/dL   Calcium,  Ion 1.25  1.13 - 1.30 mmol/L   TCO2 22  0 - 100 mmol/L   Hemoglobin 13.6  12.0 - 15.0 g/dL   HCT 40.0  36.0 - 46.0 %  I-STAT TROPOININ, ED     Status: None   Collection Time    07/27/14  3:33 AM      Result Value Ref Range   Troponin i, poc 0.06  0.00 - 0.08 ng/mL     Comment 3            Comment: Due to the release kinetics of cTnI,     a negative result within the first hours     of the onset of symptoms does not rule out     myocardial infarction with certainty.     If myocardial infarction is still suspected,     repeat the test at appropriate intervals.  URINE RAPID DRUG SCREEN (HOSP PERFORMED)     Status: None   Collection Time    07/27/14  4:30 AM      Result Value Ref Range   Opiates NONE DETECTED  NONE DETECTED   Cocaine NONE DETECTED  NONE DETECTED   Benzodiazepines NONE DETECTED  NONE DETECTED   Amphetamines NONE DETECTED  NONE DETECTED   Tetrahydrocannabinol NONE DETECTED  NONE DETECTED   Barbiturates NONE DETECTED  NONE DETECTED   Comment:            DRUG SCREEN FOR MEDICAL PURPOSES     ONLY.  IF CONFIRMATION IS NEEDED     FOR ANY PURPOSE, NOTIFY LAB     WITHIN 5 DAYS.                LOWEST DETECTABLE LIMITS     FOR URINE DRUG SCREEN     Drug Class       Cutoff (ng/mL)     Amphetamine      1000     Barbiturate      200     Benzodiazepine   789     Tricyclics       381     Opiates          300     Cocaine          300     THC              50  URINALYSIS, ROUTINE W REFLEX MICROSCOPIC     Status: Abnormal   Collection Time    07/27/14  4:30 AM      Result Value Ref Range   Color, Urine YELLOW  YELLOW   APPearance CLEAR  CLEAR   Specific Gravity, Urine <1.005 (*) 1.005 - 1.030   pH 6.0  5.0 - 8.0   Glucose, UA NEGATIVE  NEGATIVE mg/dL   Hgb urine dipstick TRACE (*) NEGATIVE   Bilirubin Urine NEGATIVE  NEGATIVE   Ketones, ur NEGATIVE  NEGATIVE mg/dL   Protein, ur NEGATIVE  NEGATIVE mg/dL   Urobilinogen, UA 0.2  0.0 - 1.0 mg/dL   Nitrite NEGATIVE  NEGATIVE   Leukocytes, UA NEGATIVE  NEGATIVE  URINE MICROSCOPIC-ADD ON     Status: None   Collection Time    07/27/14  4:30 AM      Result Value Ref Range   Squamous Epithelial / LPF RARE  RARE   WBC, UA 0-2  <3 WBC/hpf   RBC / HPF 0-2  <3 RBC/hpf   Bacteria, UA RARE   RARE  TROPONIN  I     Status: None   Collection Time    07/27/14  6:58 AM      Result Value Ref Range   Troponin I <0.30  <0.30 ng/mL   Comment:            Due to the release kinetics of cTnI,     a negative result within the first hours     of the onset of symptoms does not rule out     myocardial infarction with certainty.     If myocardial infarction is still suspected,     repeat the test at appropriate intervals.  TROPONIN I     Status: None   Collection Time    07/27/14 12:07 PM      Result Value Ref Range   Troponin I <0.30  <0.30 ng/mL   Comment:            Due to the release kinetics of cTnI,     a negative result within the first hours     of the onset of symptoms does not rule out     myocardial infarction with certainty.     If myocardial infarction is still suspected,     repeat the test at appropriate intervals.  TROPONIN I     Status: None   Collection Time    07/27/14  6:09 PM      Result Value Ref Range   Troponin I <0.30  <0.30 ng/mL   Comment:            Due to the release kinetics of cTnI,     a negative result within the first hours     of the onset of symptoms does not rule out     myocardial infarction with certainty.     If myocardial infarction is still suspected,     repeat the test at appropriate intervals.  GLUCOSE, CAPILLARY     Status: None   Collection Time    07/28/14  7:46 AM      Result Value Ref Range   Glucose-Capillary 92  70 - 99 mg/dL    Lipid Panel No results found for this basename: CHOL, TRIG, HDL, CHOLHDL, VLDL, LDLCALC,  in the last 72 hours  Studies/Results: Ct Head Wo Contrast  07/27/2014   CLINICAL DATA:  Left arm pain.  Weakness.  EXAM: CT HEAD WITHOUT CONTRAST  TECHNIQUE: Contiguous axial images were obtained from the base of the skull through the vertex without intravenous contrast.  COMPARISON:  CT of the head performed 07/11/2014, and MRI of the brain from 07/14/2014  FINDINGS: There is no evidence of acute  infarction, mass lesion, or intra- or extra-axial hemorrhage on CT.  Prominence of the ventricles and sulci reflects mild cortical volume loss. Cerebellar atrophy is noted.  The brainstem and fourth ventricle are within normal limits. The basal ganglia are unremarkable in appearance. The cerebral hemispheres demonstrate grossly normal gray-white differentiation. No mass effect or midline shift is seen.  There is no evidence of fracture; visualized osseous structures are unremarkable in appearance. The orbits are within normal limits. The paranasal sinuses and mastoid air cells are well-aerated. No significant soft tissue abnormalities are seen.  IMPRESSION: 1. No acute intracranial pathology seen on CT. 2. Mild cortical volume loss noted.   Electronically Signed   By: Garald Balding M.D.   On: 07/27/2014 04:16   Mr Jodene Nam Head Wo Contrast  07/27/2014   CLINICAL DATA:  CVA.  EXAM:  MRA HEAD WITHOUT CONTRAST  TECHNIQUE: Angiographic images of the Circle of Willis were obtained using MRA technique without intravenous contrast.  COMPARISON:  MRI brain from the same day. MRA circle of Willis 07/14/2014.  FINDINGS: The cervical right internal carotid artery is tortuous. Mild atherosclerotic changes are again noted within the cavernous carotid arteries without significant stenosis. There is focal signal loss in the distal right A1 segment which may be artifactual. The anterior communicating artery is patent. The upper portion of the study is degraded by patient motion, exaggerating small vessel disease. There is asymmetric signal loss in the proximal right A2 segment. The MCA bifurcations are intact. There is significant scattered small vessel attenuation bilaterally.  The vertebral arteries are codominant. The right PICA origin is visualized and normal. The left AICA is dominant. The basilar artery is within normal limits. There is mild signal loss in the proximal right posterior cerebral artery. Moderate signal loss is  noted in the PCA branch vessels bilaterally.  IMPRESSION: 1. Mild atherosclerotic changes within the cavernous carotid arteries bilaterally. No significant proximal lesion to account for the right MCA territory infarcts. 2. No significant proximal stenosis, aneurysm, or branch vessel occlusion. 3. Signal loss the proximal right A2 segment and right P2 segment may be exaggerated by patient motion. 4. Small branch vessel attenuation is exaggerated diffusely compared to the prior study, likely due to patient motion.   Electronically Signed   By: Lawrence Santiago M.D.   On: 07/27/2014 11:27   Mr Brain Wo Contrast  07/27/2014   CLINICAL DATA:  Left arm pain and weakness.  CVA.  EXAM: MRI HEAD WITHOUT CONTRAST  TECHNIQUE: Multiplanar, multiecho pulse sequences of the brain and surrounding structures were obtained without intravenous contrast.  COMPARISON:  CT head without contrast 07/27/2014. MRI brain without contrast 07/14/2014.  FINDINGS: The diffusion-weighted images demonstrate progression of restricted diffusion an acute nonhemorrhagic infarct within the posterior right frontal lobe along the prior motor cortex and within the subjacent white matter. A 5 mm linear area of acute infarction is noted within the right occipital lobe, new from the prior exam. Additional more anterior right frontal lobe infarcts are no longer visible on the diffusion sequence.  T2 changes are associated with the progressive right MCA territory infarcts. Periventricular and brainstem white matter changes are stable.  No hemorrhage or mass lesion is present. The ventricles are of normal size. No significant extraaxial fluid collection is present.  Flow is present in the major intracranial arteries. The globes and orbits are intact. Paranasal sinuses and mastoid air cells are clear. A relatively empty sella is stable. The craniocervical junction is within normal limits.  IMPRESSION: 1. Progressive right MCA territory infarcts involving the  posterior right frontal lobe and anterior right parietal lobe without hemorrhage. 2. New 5 mm acute nonhemorrhagic infarct within the right occipital lobe. 3. Minimal periventricular white matter changes are otherwise stable.   Electronically Signed   By: Lawrence Santiago M.D.   On: 07/27/2014 11:24   US Carotid Bilateral  07/28/2014   CLINICAL DATA:  Small acute stroke, syncope, visual disturbance, hyperlipidemia  EXAM: BILATERAL CAROTID DUPLEX ULTRASOUND  TECHNIQUE: Pearline Cables scale imaging, color Doppler and duplex ultrasound were performed of bilateral carotid and vertebral arteries in the neck.  COMPARISON:  07/27/2014  FINDINGS: Criteria: Quantification of carotid stenosis is based on velocity parameters that correlate the residual internal carotid diameter with NASCET-based stenosis levels, using the diameter of the distal internal carotid lumen as the denominator for stenosis  measurement.  The following velocity measurements were obtained:  RIGHT  ICA:  115/23 cm/sec  CCA:  52/77 cm/sec  SYSTOLIC ICA/CCA RATIO:  8.24  DIASTOLIC ICA/CCA RATIO:  2.35  ECA:  112 cm/sec  LEFT  ICA:  77/16 cm/sec  CCA:  36/1 cm/sec  SYSTOLIC ICA/CCA RATIO:  4.43  DIASTOLIC ICA/CCA RATIO:  1.54  ECA:  66 cm/sec  RIGHT CAROTID ARTERY: Minor echogenic shadowing plaque formation. No hemodynamically significant right ICA stenosis, velocity elevation, or turbulent flow. Degree of narrowing less than 50%.  RIGHT VERTEBRAL ARTERY:  Antegrade  LEFT CAROTID ARTERY: Similar scattered minor echogenic plaque formation. No hemodynamically significant left ICA stenosis, velocity elevation, or turbulent flow.  LEFT VERTEBRAL ARTERY:  Antegrade  IMPRESSION: Minor carotid atherosclerosis. No hemodynamically significant ICA stenosis by ultrasound. Degree of narrowing less than 50% bilaterally   Electronically Signed   By: Daryll Brod M.D.   On: 07/28/2014 09:47   Dg Chest Portable 1 View  07/27/2014   CLINICAL DATA:  Left arm pain and weakness.   EXAM: PORTABLE CHEST - 1 VIEW  COMPARISON:  Chest radiograph performed 07/11/2014  FINDINGS: The lungs are well expanded. Vascular congestion is noted, with mildly increased interstitial markings, possibly reflecting minimal interstitial edema. No pleural effusion or pneumothorax is seen.  The cardiomediastinal silhouette is borderline normal in size. No acute osseous abnormalities are identified.  IMPRESSION: Vascular congestion, with mildly increased interstitial markings, possibly reflecting minimal interstitial edema.   Electronically Signed   By: Garald Balding M.D.   On: 07/27/2014 04:13    Medications:  Scheduled Meds: . antiseptic oral rinse  7 mL Mouth Rinse BID  . aspirin EC  81 mg Oral Daily  . atorvastatin  10 mg Oral q1800  . enoxaparin (LOVENOX) injection  40 mg Subcutaneous Q24H  . sodium chloride  3 mL Intravenous Q12H  . sodium chloride  3 mL Intravenous Q12H  . ticagrelor  90 mg Oral BID   Continuous Infusions:  PRN Meds:.sodium chloride, acetaminophen, acetaminophen, HYDROcodone-acetaminophen, ondansetron (ZOFRAN) IV, ondansetron, sodium chloride, temazepam, traMADol     LOS: 1 day   Rosabel Sermeno A. Merlene Laughter, M.D.  Diplomate, Tax adviser of Psychiatry and Neurology ( Neurology).

## 2014-07-28 NOTE — Progress Notes (Signed)
Patient ID: Alexandria Castillo, female   DOB: 01/22/1929, 80 y.o.   MRN: [id number]  Order received for TEE. Patient is 80 yo female admitted with CVA, found to have right hemisphere CVA involving multiple vascular territories worrisome for cardioembolic source. Patient had breakfast this morning, will plan for procedure tomorrow. Will make NPO at midnight.   Dominga FerryJ Briawna Carver MD

## 2014-07-28 NOTE — Progress Notes (Signed)
PROGRESS NOTE  Alexandria HurtMary B Castillo WJX:[id number] DOB: 01/30/1929 DOA: 07/27/2014 PCP: [name],[name], MD  Summary: 80 year old woman hospitalized less than 2 weeks ago with numbness and tingling of the left arm as well as chest pain. Underwent cath for STEMI, 99% RCA blockage treated with stent, at same time MRI brain revealed embolic right brain infarcts thought to be secondary to acute MI. She was started on aspirin.  She presented to [facility] 8/10 with complaints of left upper extremity weakness and paresthesias.  She was recently in the hospital the end of July. At that time she was admitted for trouble with arm weakness. She eventually had an MRI of the brain showing right frontal and parietal strokes. While in the hospital she developed acute pain in her arm and had an EKG showing ST elevation MI. She underwent cardiac catheterization and had a stent placed.  Assessment/Plan: Multiple stroke involving the right hemisphere in 2 or 3 vascular distribution:  Left upper extremity weakness with pronator drift. Highly suspicious for recurrent stroke.  S/p embolic right brain infarcts 2/13087/2015 thought secondary to acute MI S/p STEMI with PCI/stent 06/2014. asymptomatic. No chest pain. Troponin negative. EKG without acute changes. -appreciate neurology -TEE -holter monitor -carotids pending  -Continue ASA and Brilinta -Keep outpatient followup with Dr. Roda ShuttersXu at vascular neurologic clinic in 4 weeks  Code Status: full code DVT prophylaxis: Lovenox Family Communication: husband, son, daughter Disposition Plan: home  [name] DO  [facility]  Pager [phone] If 7PM-7AM, please contact night-coverage at [email or web address], password [email or web address] 07/28/2014, 7:54 AM  LOS: 1 day   Consultants:  Neurology  Procedures:    Antibiotics:    HPI/Subjective: C/o not sleeping well  Objective: Filed Vitals:   07/27/14 1245 07/27/14 2045 07/27/14 2342 07/28/14 0430  BP: 127/55 143/62 117/80 131/54  Pulse: 72  67 82 74  Temp: 98.2 F (36.8 C) 98 F (36.7 C) 98.1 F (36.7 C) 98.1 F (36.7 C)  TempSrc: Oral Oral Oral Oral  Resp: 18 20 20 18   Height:      Weight:      SpO2: 96% 94% 97% 96%    Intake/Output Summary (Last 24 hours) at 07/28/14 0754 Last data filed at 07/27/14 1730  Gross per 24 hour  Intake    490 ml  Output      0 ml  Net    490 ml     Filed Weights   07/27/14 0305 07/27/14 0621  Weight: 87.091 kg (192 lb) 87.6 kg (193 lb 2 oz)    Exam:     AF VSS Gen. Appears calm and comfortable  Psych. Speech fluent and clear  Cardiovascular. Regular rate and rhythm. Telemetry sinus rhythm. No murmur, rub gallop. Plus bilateral lower extremity edema.  Respiratory. Clear to auscultation bilaterally. No wheezes, rales or rhonchi. Normal respiratory effort.  Musculoskeletal. Bilateral lower extremity strength appears grossly normal and symmetric. Right upper extremity strength appears grossly normal. The left upper extremity significantly weak, unable to raise arm more than a foot or so of the bed. Very weak grip. Unable to touch her nose.  Neurologic. Cranial nerves appear intact.    Scheduled Meds: . antiseptic oral rinse  7 mL Mouth Rinse BID  . aspirin EC  81 mg Oral Daily  . atorvastatin  10 mg Oral q1800  . enoxaparin (LOVENOX) injection  40 mg Subcutaneous Q24H  . sodium chloride  3 mL Intravenous Q12H  . sodium chloride  3 mL Intravenous Q12H  . ticagrelor  90 mg Oral BID   Continuous Infusions:   Principal Problem:   Left arm weakness Active Problems:   CAD S/P percutaneous coronary angioplasty: Promus Premier DES   Presence of drug coated stent in right coronary artery   Stroke   Time spent 25 minutes

## 2014-07-28 NOTE — Evaluation (Signed)
Physical Therapy Evaluation Patient Details Name: Alexandria Castillo MRN: [id number] DOB: [birth year] Today's Date: 07/28/2014   History of Present Illness  80 year old female who has a past medical history of Hiatal hernia; Hypercholesterolemia; Fractured tibia; Hip pain; PE (pulmonary embolism); Complication of anesthesia; PONV (postoperative nausea and vomiting); Myocardial infarct; Stroke; and Coronary artery disease.  Patient presented with numbness and tingling of the left arm on 07/11/2014, at that time patient also had chest pain and STEMI underwent cardiac cath which showed 99% blockage at RCA which was stented with drug eluding stent, at the same time MRI brain showed embolic right brain infarcts thought to be likely due to acute MI. Patient was started on aspirin for stroke prevention and ticargrelor for STEMI.  Today patient presents with worsening weakness of the left arm along with numbness and tingling. The symptoms started last night, around 9:30 PM. She denies slurred speech, no blurry vision, did not pass out, no seizure-like activity. No chest pain or shortness of breath. Patient has been taking her medications as prescribed. She continues to have numbness tingling and weakness of the left arm. Patient has chronic left lower extremity pain after tibial fracture. She takes tramadol  50 mg 2 tablets 5 times a day.  The patient's brain MRI is reviewed and compared to the previous scan. There are new or increased signal seen on diffusion scan involving the temporal parietal lobe. There is also single lesion noted in the occipital lobe. These are all on the right side. These are multiple lesions in at least 2 vascular distribution. The MCA and PCA distribution on the right side. It appears to be extension of the previous strokes with resolution of some areas. No significant large vessel disease is noted.  Clinical Impression  Pt is an 80 year old female who presents to physical therapy with dx of Lt  UE weakness. Pt recently had a stroke and was receiving home health services for rehabilitation prior to this hospitalization.  New MRI reveals changes on the Rt side impacting Lt sided strength.  During evaluation, pt required mod/max assist for bed mobility skills, and mod assist for transfers secondary to limited ability to assist with Lt UE.  Lt UE screened by PT, with some AROM of Lt shoulder and elbow, though no grip strength noted.  Pt required PT assist for hand placement on RW, and hand over hand assist to maintain contact with AD.  Unable to ambulate at this time as pt unable to stand upright; noted leaning the Rt to weightbear on Rt forearm requiring VC for upright posturing. Pt able to side step to the Rt to the [facility] with some assistance from PT.  Recommend continued PT to address strengthening, activity tolerance, and balance for improved functional mobility skills.      Follow Up Recommendations Home health PT;Supervision/Assistance - 24 hour (Pt declined SNF placement preferring to go home with family 24/7 support and [facility] services resumed)    Equipment Recommendations  Other (comment) Investment banker, operational (Lt platform) if grip strength does not improve for pt to hold onto personal rollator)    Recommendations for Other Services OT consult (Requested OT consult due to Lt UE weakness, no grip strength)     Precautions / Restrictions Precautions Precautions: Fall Restrictions Weight Bearing Restrictions: No      Mobility  Bed Mobility Overal bed mobility: Needs Assistance Bed Mobility: Supine to Sit;Sit to Supine     Supine to sit: Mod assist;Max assist (for trunk) Sit to  supine: Mod assist;Max assist (for LE)   General bed mobility comments: VC for technique as pt had limited ability to use Lt UE as she does not have grip strength to grab onto handrail or PT hand to assist in transfer. Pt was able to bridge in bed (I) to scoot up/sideways in bed.   Transfers Overall transfer  level: Needs assistance Equipment used: Rolling walker (2 wheeled) Transfers: Sit to/from Stand Sit to Stand: Mod assist         General transfer comment: Once in standing, pt leaned to the Rt to lean Rt forearm on RW for balance; multiple cueing required for attempts at upright posturing.   Ambulation/Gait             General Gait Details: Pt was able to side step to the Rt with PT assist for movement of RW and maintaining hand contact on RW as pt has no grip strength.   Stairs            Wheelchair Mobility    Modified Rankin (Stroke Patients Only) Modified Rankin (Stroke Patients Only) Pre-Morbid Rankin Score: Moderate disability (Score based on pt PLOF after first stroke) Modified Rankin: Moderately severe disability     Balance Overall balance assessment: Needs assistance Sitting-balance support: Single extremity supported;Feet supported Sitting balance-Leahy Scale: Fair   Postural control: Posterior lean Standing balance support: Bilateral upper extremity supported;During functional activity Standing balance-Leahy Scale: Poor Standing balance comment: Pt leans to the Rt to support body as she has no grip strength in Lt hand to hold onto RW                             Pertinent Vitals/Pain Pain Assessment: 0-10 Pain Score: 4  Pain Location: Lt LE    Home Living Family/patient expects to be discharged to:: Private residence Living Arrangements: Spouse/significant other (2 sons, 2 daughters live nearby) Available Help at Discharge: Family;Available 24 hours/day Type of Home: House Home Access: Ramped entrance     Home Layout: One level Home Equipment: Wheelchair - power;Bedside commode;Hand held shower head;Tub bench (Rollator) Additional Comments: Tub shower    Prior Function     Gait / Transfers Assistance Needed: Using a rollator for short distances, ~25% of the day.  Pt reports she primarily uses her power W/C for mobility skills  (chair fits into all rooms except bathroom).   ADL's / Homemaking Assistance Needed: Pt reports she requires some assistance with dressing and bathing.         Hand Dominance   Dominant Hand: Right    Extremity/Trunk Assessment   Upper Extremity Assessment: Defer to OT evaluation;LUE deficits/detail       LUE Deficits / Details: Pt reports improving strength in shoulder and elbow, with pt demonstrating some AROM, though no grip strength noted in Lt hand.  Asked for OT evaluation to determine appropriate AD pt will require with gait (possible transition to platform walker as pt unable to grip with Lt hand)   Lower Extremity Assessment: Generalized weakness;RLE deficits/detail;LLE deficits/detail RLE Deficits / Details: MMT hip 3/5, knee 4-/5, ankle 4/5 LLE Deficits / Details: Decreased knee extension, which pt reports is chronic from her tibia surgery.  MMT hip 3/5, knee 4-/5, ankle 4/5     Communication   Communication: HOH  Cognition Arousal/Alertness: Awake/alert Behavior During Therapy: WFL for tasks assessed/performed Overall Cognitive Status: Within Functional Limits for tasks assessed  Assessment/Plan    PT Assessment Patient needs continued PT services  PT Diagnosis Difficulty walking;Generalized weakness   PT Problem List Decreased strength;Decreased range of motion;Decreased activity tolerance;Decreased balance;Decreased mobility;Decreased knowledge of use of DME;Decreased safety awareness;Decreased knowledge of precautions  PT Treatment Interventions DME instruction;Gait training;Stair training;Functional mobility training;Therapeutic activities;Therapeutic exercise;Neuromuscular re-education;Patient/family education   PT Goals (Current goals can be found in the Care Plan section) Acute Rehab PT Goals Patient Stated Goal: To return home with husband. PT Goal Formulation: With patient Time For Goal Achievement:  08/11/14 Potential to Achieve Goals: Good    Frequency Min 3X/week    End of Session Equipment Utilized During Treatment: Gait belt;Oxygen Activity Tolerance: Patient limited by fatigue Patient left: in bed;with call bell/phone within reach;with bed alarm set           Time: 4098-1191 PT Time Calculation (min): 24 min   Charges:   PT Evaluation $Initial PT Evaluation Tier I: 1 Procedure          Edilson Vital 07/28/2014, 4:00 PM

## 2014-07-29 ENCOUNTER — Encounter (HOSPITAL_COMMUNITY)
Admission: EM | Disposition: A | Discharge status: Home Health | Payer: Self-pay | Source: Home / Self Care | Attending: Internal Medicine

## 2014-07-29 ENCOUNTER — Encounter (HOSPITAL_COMMUNITY): Payer: Self-pay | Admitting: *Deleted

## 2014-07-29 DIAGNOSIS — I059 Rheumatic mitral valve disease, unspecified: Secondary | ICD-10-CM

## 2014-07-29 HISTORY — PX: TEE WITHOUT CARDIOVERSION: SHX5443

## 2014-07-29 LAB — CBC
HCT: 37.9 % (ref 36.0–46.0)
Hemoglobin: 12.9 g/dL (ref 12.0–15.0)
MCH: 30.1 pg (ref 26.0–34.0)
MCHC: 34 g/dL (ref 30.0–36.0)
MCV: 88.6 fL (ref 78.0–100.0)
Platelets: 238 10*3/uL (ref 150–400)
RBC: 4.28 MIL/uL (ref 3.87–5.11)
RDW: 16.1 % — AB (ref 11.5–15.5)
WBC: 10.1 10*3/uL (ref 4.0–10.5)

## 2014-07-29 LAB — BASIC METABOLIC PANEL
Anion gap: 13 (ref 5–15)
BUN: 11 mg/dL (ref 6–23)
CALCIUM: 9.5 mg/dL (ref 8.4–10.5)
CO2: 24 mEq/L (ref 19–32)
Chloride: 102 mEq/L (ref 96–112)
Creatinine, Ser: 0.99 mg/dL (ref 0.50–1.10)
GFR calc non Af Amer: 52 mL/min — ABNORMAL LOW (ref >90)
GFR, EST AFRICAN AMERICAN: 60 mL/min — AB (ref >90)
Glucose, Bld: 90 mg/dL (ref 70–99)
POTASSIUM: 3.9 meq/L (ref 3.7–5.3)
SODIUM: 139 meq/L (ref 137–147)

## 2014-07-29 SURGERY — ECHOCARDIOGRAM, TRANSESOPHAGEAL
Anesthesia: Moderate Sedation

## 2014-07-29 MED ORDER — SODIUM CHLORIDE BACTERIOSTATIC 0.9 % IJ SOLN
INTRAMUSCULAR | Status: AC
Start: 2014-07-29 — End: 2014-07-30
  Filled 2014-07-29: qty 10

## 2014-07-29 MED ORDER — SODIUM CHLORIDE 0.9 % IV SOLN
INTRAVENOUS | Status: DC
  Administered 2014-07-29: 14:00:00 via INTRAVENOUS

## 2014-07-29 MED ORDER — BUTAMBEN-TETRACAINE-BENZOCAINE 2-2-14 % EX AERO
INHALATION_SPRAY | CUTANEOUS | Status: DC | PRN
  Administered 2014-07-29: 2 via TOPICAL

## 2014-07-29 MED ORDER — ONDANSETRON HCL 4 MG/2ML IJ SOLN
INTRAMUSCULAR | Status: AC
  Filled 2014-07-29: qty 2

## 2014-07-29 MED ORDER — FENTANYL CITRATE 0.05 MG/ML IJ SOLN
INTRAMUSCULAR | Status: AC
  Filled 2014-07-29: qty 2

## 2014-07-29 MED ORDER — MIDAZOLAM HCL 5 MG/5ML IJ SOLN
INTRAMUSCULAR | Status: AC
  Filled 2014-07-29: qty 5

## 2014-07-29 MED ORDER — FENTANYL CITRATE 0.05 MG/ML IJ SOLN
INTRAMUSCULAR | Status: DC | PRN
  Administered 2014-07-29 (×3): 25 ug via INTRAVENOUS

## 2014-07-29 MED ORDER — LIDOCAINE VISCOUS 2 % MT SOLN
OROMUCOSAL | Status: AC
  Filled 2014-07-29: qty 15

## 2014-07-29 MED ORDER — LIDOCAINE VISCOUS 2 % MT SOLN
OROMUCOSAL | Status: DC | PRN
  Administered 2014-07-29 (×2): 7.5 mL via OROMUCOSAL

## 2014-07-29 MED ORDER — MIDAZOLAM HCL 5 MG/5ML IJ SOLN
INTRAMUSCULAR | Status: DC | PRN
  Administered 2014-07-29: 1 mg via INTRAVENOUS
  Administered 2014-07-29: 0.5 mg via INTRAVENOUS
  Administered 2014-07-29: 1 mg via INTRAVENOUS
  Administered 2014-07-29: .5 mg via INTRAVENOUS
  Administered 2014-07-29: 0.5 mg via INTRAVENOUS

## 2014-07-29 MED ORDER — ONDANSETRON HCL 4 MG/2ML IJ SOLN
4.0000 mg | Freq: Once | INTRAMUSCULAR | Status: AC
  Administered 2014-07-29: 4 mg via INTRAVENOUS

## 2014-07-29 NOTE — H&P (Signed)
Procedure H&P  Admission H&P as documented below reviewed. 80 yo female admitted with CVA, multiple territories involved concerning for possible cardioembolic source. Will plan for TEE today.   Dominga Ferry MD     Admission H&P  Chief Complaint:   Left arm weakness   HPI:  80 year old female who  has a past medical history of Hiatal hernia; Hypercholesterolemia; Fractured tibia; Hip pain; PE (pulmonary embolism); Complication of anesthesia; PONV (postoperative nausea and vomiting); Myocardial infarct; Stroke; and Coronary artery disease. Patient presented with numbness and tingling of the left arm on 07/11/2014, at that time patient also had chest pain and STEMI underwent cardiac cath which showed 99% blockage at RCA which was stented with drug eluding stent, at the same time MRI brain showed embolic right brain infarcts thought to be likely due to acute MI. Patient was started on aspirin for stroke prevention and ticargrelor for STEMI.   Today patient presents with worsening weakness of the left arm along with numbness and tingling. The symptoms started last night, around 9:30 PM. She denies slurred speech, no blurry vision, did not pass out, no seizure-like activity. No chest pain or shortness of breath. Patient has been taking her medications as prescribed. She continues to have numbness tingling and weakness of the left arm. Patient has chronic left lower extremity pain after tibial fracture. She takes tramadol  50 mg 2 tablets 5 times a day.   Allergies:    Allergies   Allergen  Reactions   .  Baycol [Cerivastatin]     .  Ciprofloxacin     .  Decadron [Dexamethasone]     .  Imipramine     .  Oxycodone     .  Premarin [Conjugated Estrogens]     .  Synthroid [Levothyroxine]            Past Medical History   Diagnosis  Date   .  Hiatal hernia     .  Hypercholesterolemia     .  Fractured tibia     .  Hip pain     .  PE (pulmonary embolism)     .  Complication of  anesthesia     .  PONV (postoperative nausea and vomiting)     .  Myocardial infarct     .  Stroke     .  Coronary artery disease           Past Surgical History   Procedure  Laterality  Date   .  Abdominal hysterectomy       .  Leg surgery       .  Esophagogastroduodenoscopy (egd) with esophageal dilation    12/04/2012       Procedure: ESOPHAGOGASTRODUODENOSCOPY (EGD) WITH ESOPHAGEAL DILATION;  Surgeon: Malissa Hippo, MD;  Location: [facility];  Service: Endoscopy;  Laterality: N/A;  255-rescheduled to 13:00 Ann to notify pt   .  Fracture surgery           Left Knee   .  Coronary stent placement             Prior to Admission medications    Medication  Sig  Start Date  End Date  Taking?  Authorizing Provider   aspirin 81 MG tablet  Take 1 tablet (81 mg total) by mouth daily.  07/14/14    Yes  Rhonda G Barrett, PA-C   atorvastatin (LIPITOR) 10 MG tablet  Take 1 tablet (10 mg total) by mouth  daily at 6 PM.  07/14/14    Yes  Rhonda G Barrett, PA-C   Calcium-Vitamin D (CALTRATE 600 PLUS-VIT D PO)  Take 1 tablet by mouth daily.      Yes  Historical Provider, MD   metoprolol succinate (TOPROL XL) 25 MG 24 hr tablet  Take 1 tablet (25 mg total) by mouth daily.  07/14/14    Yes  Rhonda G Barrett, PA-C   Omega-3 Fatty Acids (FISH OIL) 1200 MG CAPS  Take 1,200 mg by mouth daily.      Yes  Historical Provider, MD   OVER THE COUNTER MEDICATION  Take 1 capsule by mouth daily. Flora 50 Billion units      Yes  Historical Provider, MD   ramipril (ALTACE) 2.5 MG capsule  Take 1 capsule (2.5 mg total) by mouth daily.  07/14/14    Yes  Rhonda G Barrett, PA-C   ticagrelor (BRILINTA) 90 MG TABS tablet  Take 1 tablet (90 mg total) by mouth 2 (two) times daily.  07/14/14    Yes  Rhonda G Barrett, PA-C   traMADol (ULTRAM) 50 MG tablet  Take 50-100 mg by mouth every 6 (six) hours as needed for moderate pain or severe pain. Pain.      Yes  Historical Provider, MD   vitamin B-12 (CYANOCOBALAMIN) 500 MCG  tablet  Take 500 mcg by mouth daily.      Yes  Historical Provider, MD   Vitamin D, Ergocalciferol, (DRISDOL) 50000 UNITS CAPS  Take 50,000 Units by mouth every 7 (seven) days.      Yes  Historical Provider, MD        Social History:  reports that she has never smoked. She does not have any smokeless tobacco history on file. She reports that she does not drink alcohol or use illicit drugs.   No family history on file.     All the positives are listed in BOLD   Review of Systems:  HEENT: Headache, blurred vision, runny nose, sore throat Neck: Hypothyroidism, hyperthyroidism,,lymphadenopathy Chest : Shortness of breath, history of COPD, Asthma Heart : Chest pain, history of coronary arterey disease GI:  Nausea, vomiting, diarrhea, constipation, GERD GU: Dysuria, urgency, frequency of urination, hematuria Neuro: Stroke, seizures, syncope Psych: Depression, anxiety, hallucinations     Physical Exam: Blood pressure 137/99, pulse 77, temperature 97.6 F (36.4 C), temperature source Oral, resp. rate 18, height 5\' 3"  (1.6 m), weight 87.091 kg (192 lb), SpO2 98.00%. Constitutional:   Patient is a well-developed and well-nourished female* in no acute distress and cooperative with exam. Head: Normocephalic and atraumatic Mouth: Mucus membranes moist Eyes: PERRL, EOMI, conjunctivae normal Neck: Supple, No Thyromegaly Cardiovascular: RRR, S1 normal, S2 normal Pulmonary/Chest: CTAB, no wheezes, rales, or rhonchi Abdominal: Soft. Non-tender, non-distended, bowel sounds are normal, no masses, organomegaly, or guarding present.  Neurological: A&O x3, Strenght is normal and symmetric in the right upper and lower extremity, strength is 3/5 in the left upper extremity, 5/ 5 in left lower extremity, DTRs are 2+ bilaterally, plantars reflexes are downgoing bilaterally, cranial nerve II-XII are grossly intact, no focal motor deficit, sensory reduced sensation in left arm. Extremities : No  Cyanosis, Clubbing or Edema   Labs on Admission:  Basic Metabolic Panel:   Recent Labs Lab  07/27/14 0300  07/27/14 0333   NA  134*  135*   K  3.9  3.6*   CL  98  99   CO2  24   --  GLUCOSE  112*  112*   BUN  15  15   CREATININE  1.18*  1.20*   CALCIUM  9.6   --       Liver Function Tests:   Recent Labs Lab  07/27/14 0300   AST  15   ALT  8   ALKPHOS  76   BILITOT  0.4   PROT  6.8   ALBUMIN  3.5      No results found for this basename: LIPASE, AMYLASE,  in the last 168 hours No results found for this basename: AMMONIA,  in the last 168 hours CBC:   Recent Labs Lab  07/27/14 0300  07/27/14 0333   WBC  10.4   --    NEUTROABS  7.2   --    HGB  12.9  13.6   HCT  37.9  40.0   MCV  88.1   --    PLT  234   --       Cardiac Enzymes: No results found for this basename: CKTOTAL, CKMB, CKMBINDEX, TROPONINI,  in the last 168 hours   BNP (last 3 results) No results found for this basename: PROBNP,  in the last 8760 hours CBG: No results found for this basename: GLUCAP,  in the last 168 hours   Radiological Exams on Admission: Ct Head Wo Contrast   07/27/2014   CLINICAL DATA:  Left arm pain.  Weakness.  EXAM: CT HEAD WITHOUT CONTRAST  TECHNIQUE: Contiguous axial images were obtained from the base of the skull through the vertex without intravenous contrast.  COMPARISON:  CT of the head performed 07/11/2014, and MRI of the brain from 07/14/2014  FINDINGS: There is no evidence of acute infarction, mass lesion, or intra- or extra-axial hemorrhage on CT.  Prominence of the ventricles and sulci reflects mild cortical volume loss. Cerebellar atrophy is noted.  The brainstem and fourth ventricle are within normal limits. The basal ganglia are unremarkable in appearance. The cerebral hemispheres demonstrate grossly normal gray-white differentiation. No mass effect or midline shift is seen.  There is no evidence of fracture; visualized osseous structures are unremarkable  in appearance. The orbits are within normal limits. The paranasal sinuses and mastoid air cells are well-aerated. No significant soft tissue abnormalities are seen.  IMPRESSION: 1. No acute intracranial pathology seen on CT. 2. Mild cortical volume loss noted.   Electronically Signed   By: Roanna Raider M.D.   On: 07/27/2014 04:16    Dg Chest Portable 1 View   07/27/2014   CLINICAL DATA:  Left arm pain and weakness.  EXAM: PORTABLE CHEST - 1 VIEW  COMPARISON:  Chest radiograph performed 07/11/2014  FINDINGS: The lungs are well expanded. Vascular congestion is noted, with mildly increased interstitial markings, possibly reflecting minimal interstitial edema. No pleural effusion or pneumothorax is seen.  The cardiomediastinal silhouette is borderline normal in size. No acute osseous abnormalities are identified.  IMPRESSION: Vascular congestion, with mildly increased interstitial markings, possibly reflecting minimal interstitial edema.   Electronically Signed   By: Roanna Raider M.D.   On: 07/27/2014 04:13       EKG: Independently reviewed. Right bundle [place] Pell[place]ane block     Assessment/Plan Principal Problem:   Left arm weakness Active Problems:   CAD S/P percutaneous coronary angioplasty: Promus Premier DES   Presence of drug coated stent in right coronary artery   Stroke   Left arm weakness/? CVA Patient had left arm weakness when she was discharged from the  hospital, and she was receiving physical therapy at home and was making good improvement. And now patient has worsening weakness of the left arm, which could be due to new CVA. In the previous admission patient had embolic shower in the region of the right middle cerebral artery, 2-D echo showed left atrial enlargement and akinesis of the basal mid inferolateral and inferior myocardium. I will obtain an MRI/MRA brain to rule out CVA, if again it shows new stroke. Patient may need transesophageal echocardiogram to rule out cardiac  thrombus. We'll continue the patient on aspirin and Brillinta. I will hold metoprolol and ramipril to the stroke is ruled out for permissive hypertension   CAD status post drug eluding stent in the right coronary artery Patient denies chest pain, she has been taking aspirin and Brillinta as prescribed, will continue these medications. I will check serial cardiac enzymes to rule out acute coronary syndrome presenting as left arm weakness as stent thrombosis can occur with drug eluding stent. Metoprolol and ramipril on hold, till the stroke is ruled out. Consider starting these medications if MRI shows no stroke.   DVT prophylaxis Lovenox Code status: Patient is full code   Family discussion: Admission, patients condition and plan of care including tests being ordered have been discussed with the patient and her daughter at bedside* who indicate understanding and agree with the plan and Code Status.     Time Spent on Admission: 75 minutes   LAMA,GAGAN S [facility] Pager: [phone] 07/27/2014, 6:04 AM   If 7PM-7AM, please contact night-coverage   [email or web address]   Password [email or web address]

## 2014-07-29 NOTE — Progress Notes (Signed)
Patient ID: Alexandria Castillo, female   DOB: 16-May-1929, 80 y.o.   MRN: [id number]  [place] A. Merlene Laughter, MD     [email or web address]          Alexandria Castillo is an 80 y.o. female.   Assessment/Plan: 1. Multiple stroke involving the right hemisphere in 2 or 3 vascular distribution. The phenomenon is worrisome for cardioembolic etiology. Consequent, a four-week event monitor/Holter monitor will be obtained.  This is due to be done tomorrow. She will be made nothing by mouth. The patient is currently on maximal medical treatment and therefore I'll continue with this ( Dual antiplatelet agent and a cholesterol medication). TEE is unremarkable. He does find though the patient to be discharged from my standpoint. Follow-up in the office in 2 months. OT/PT.  She reports not having periorbital numbness today. She overall is doing about the same. She status post TEE did well.    GENERAL: This very pleasant overweight female in no acute distress.  HEENT: Supple. Atraumatic normocephalic. There is hearing impairment bilaterally.  ABDOMEN: soft  EXTREMITIES: No edema. There is arthritic changes noted of the upper and lower extremities.  BACK: Normal.  SKIN: Normal by inspection.  MENTAL STATUS: Alert and oriented. Speech, language and cognition are generally intact. Judgment and insight normal.  CRANIAL NERVES: Pupils are equal, round and reactive to light and accommodation; extra ocular movements are full, there is no significant nystagmus; visual fields are full; upper and lower facial muscles are normal in strength and symmetric, there is no flattening of the nasolabial folds; tongue is midline; uvula is midline; shoulder elevation is normal. Visual fields are intact.  MOTOR: Proximal left upper extremity 3/5 but hand movement 0/5. The other extremities shows normal tone, bulk and strength. This seems to be some neglect today of the left side.  COORDINATION: Left finger to nose is normal,  right finger to nose is normal, No rest tremor; no intention tremor; no postural tremor; no bradykinesia.  REFLEXES: Deep tendon reflexes are symmetrical and normal.    TEE Conclusions  1. Please see TEE report for full details  2. No evidence of intracardiac thrombus. Normal left atrial appendage with normal emptying velocity  3. No evidence of intracardiac shunt, negative bubble study    Objective: Vital signs in last 24 hours: Temp:  [98.1 F (36.7 C)-98.9 F (37.2 C)] 98.3 F (36.8 C) (08/12 1618) Pulse Rate:  [76-84] 84 (08/12 1618) Resp:  [16-20] 16 (08/12 1618) BP: (131-175)/(65-89) 131/67 mmHg (08/12 1618) SpO2:  [91 %-97 %] 97 % (08/12 1618) Weight:  [86.4 kg (190 lb 7.6 oz)] 86.4 kg (190 lb 7.6 oz) (08/12 0519)  Intake/Output from previous day: 08/11 0701 - 08/12 0700 In: 330 [P.O.:330] Out: -  Intake/Output this shift:   Nutritional status: Cardiac   Lab Results: Results for orders placed during the hospital encounter of 07/27/14 (from the past 48 hour(s))  GLUCOSE, CAPILLARY     Status: None   Collection Time    07/28/14  7:46 AM      Result Value Ref Range   Glucose-Capillary 92  70 - 99 mg/dL  CBC     Status: Abnormal   Collection Time    07/29/14  6:05 AM      Result Value Ref Range   WBC 10.1  4.0 - 10.5 K/uL   RBC 4.28  3.87 - 5.11 MIL/uL   Hemoglobin 12.9  12.0 - 15.0 g/dL   HCT 37.9  36.0 -  46.0 %   MCV 88.6  78.0 - 100.0 fL   MCH 30.1  26.0 - 34.0 pg   MCHC 34.0  30.0 - 36.0 g/dL   RDW 16.1 (*) 11.5 - 15.5 %   Platelets 238  150 - 400 K/uL  BASIC METABOLIC PANEL     Status: Abnormal   Collection Time    07/29/14  6:05 AM      Result Value Ref Range   Sodium 139  137 - 147 mEq/L   Potassium 3.9  3.7 - 5.3 mEq/L   Chloride 102  96 - 112 mEq/L   CO2 24  19 - 32 mEq/L   Glucose, Bld 90  70 - 99 mg/dL   BUN 11  6 - 23 mg/dL   Creatinine, Ser 0.99  0.50 - 1.10 mg/dL   Calcium 9.5  8.4 - 10.5 mg/dL   GFR calc non Af Amer 52 (*) >90 mL/min     GFR calc Af Amer 60 (*) >90 mL/min   Comment: (NOTE)     The eGFR has been calculated using the CKD EPI equation.     This calculation has not been validated in all clinical situations.     eGFR's persistently <90 mL/min signify possible Chronic Kidney     Disease.   Anion gap 13  5 - 15    Lipid Panel No results found for this basename: CHOL, TRIG, HDL, CHOLHDL, VLDL, LDLCALC,  in the last 72 hours  Studies/Results: US Carotid Bilateral  07/28/2014   CLINICAL DATA:  Small acute stroke, syncope, visual disturbance, hyperlipidemia  EXAM: BILATERAL CAROTID DUPLEX ULTRASOUND  TECHNIQUE: Pearline Cables scale imaging, color Doppler and duplex ultrasound were performed of bilateral carotid and vertebral arteries in the neck.  COMPARISON:  07/27/2014  FINDINGS: Criteria: Quantification of carotid stenosis is based on velocity parameters that correlate the residual internal carotid diameter with NASCET-based stenosis levels, using the diameter of the distal internal carotid lumen as the denominator for stenosis measurement.  The following velocity measurements were obtained:  RIGHT  ICA:  115/23 cm/sec  CCA:  62/69 cm/sec  SYSTOLIC ICA/CCA RATIO:  4.85  DIASTOLIC ICA/CCA RATIO:  4.62  ECA:  112 cm/sec  LEFT  ICA:  77/16 cm/sec  CCA:  70/3 cm/sec  SYSTOLIC ICA/CCA RATIO:  5.00  DIASTOLIC ICA/CCA RATIO:  9.38  ECA:  66 cm/sec  RIGHT CAROTID ARTERY: Minor echogenic shadowing plaque formation. No hemodynamically significant right ICA stenosis, velocity elevation, or turbulent flow. Degree of narrowing less than 50%.  RIGHT VERTEBRAL ARTERY:  Antegrade  LEFT CAROTID ARTERY: Similar scattered minor echogenic plaque formation. No hemodynamically significant left ICA stenosis, velocity elevation, or turbulent flow.  LEFT VERTEBRAL ARTERY:  Antegrade  IMPRESSION: Minor carotid atherosclerosis. No hemodynamically significant ICA stenosis by ultrasound. Degree of narrowing less than 50% bilaterally   Electronically Signed   By:  Daryll Brod M.D.   On: 07/28/2014 09:47    Medications:  Scheduled Meds: . antiseptic oral rinse  7 mL Mouth Rinse BID  . aspirin EC  81 mg Oral Daily  . atorvastatin  10 mg Oral q1800  . enoxaparin (LOVENOX) injection  40 mg Subcutaneous Q24H  . fentaNYL      . lidocaine      . midazolam      . ondansetron      . sodium chloride  3 mL Intravenous Q12H  . sodium chloride  3 mL Intravenous Q12H  . sodium chloride bacteriostatic      .  ticagrelor  90 mg Oral BID   Continuous Infusions:  PRN Meds:.sodium chloride, acetaminophen, acetaminophen, HYDROcodone-acetaminophen, ondansetron (ZOFRAN) IV, ondansetron, sodium chloride, temazepam, traMADol     LOS: 2 days   Riya [place]ux[place] A. Merlene Laughter, M.D.  Diplomate, Tax adviser of Psychiatry and Neurology ( Neurology).

## 2014-07-29 NOTE — Procedures (Signed)
Procedure Note  Procedure: TEE Indication: CVA Performing physician: Dr Dina RichJonathan Waunita Sandstrom   Patient was brought to the endoscopy suite after appropriate consent was obtained. The oropharynx was anesthestized with viscous lidocaine and cetacaine spray. Moderate sedation was achieved with 3.5mg  of versed, 75 mcg of fentanyl. 4mg  of IV zofran was used as well. The TEE probe was intubated without complication into the esophagus. Several views were taken. The patient tolerated the procedure well without complications  Conclusions 1. Please see TEE report for full details 2. No evidence of intracardiac thrombus. Normal left atrial appendage with normal emptying velocity 3. No evidence of intracardiac shunt, negative bubble study   Dominga FerryJ Jaida Basurto MD

## 2014-07-29 NOTE — Evaluation (Signed)
Occupational Therapy Evaluation Patient Details Name: Alexandria Castillo MRN: [id number] DOB: 1929-10-09 Today's Date: 07/29/2014    History of Present Illness 80 year old female who has a past medical history of Hiatal hernia; Hypercholesterolemia; Fractured tibia; Hip pain; PE (pulmonary embolism); Complication of anesthesia; PONV (postoperative nausea and vomiting); Myocardial infarct; Stroke; and Coronary artery disease.  Patient presented with numbness and tingling of the left arm on 07/11/2014, at that time patient also had chest pain and STEMI underwent cardiac cath which showed 99% blockage at RCA which was stented with drug eluding stent, at the same time MRI brain showed embolic right brain infarcts thought to be likely due to acute MI. Patient was started on aspirin for stroke prevention and ticargrelor for STEMI.  Today patient presents with worsening weakness of the left arm along with numbness and tingling. The symptoms started last night, around 9:30 PM. She denies slurred speech, no blurry vision, did not pass out, no seizure-like activity. No chest pain or shortness of breath. Patient has been taking her medications as prescribed. She continues to have numbness tingling and weakness of the left arm. Patient has chronic left lower extremity pain after tibial fracture. She takes tramadol  50 mg 2 tablets 5 times a day.  The patient's brain MRI is reviewed and compared to the previous scan. There are new or increased signal seen on diffusion scan involving the temporal parietal lobe. There is also single lesion noted in the occipital lobe. These are all on the right side. These are multiple lesions in at least 2 vascular distribution. The MCA and PCA distribution on the right side. It appears to be extension of the previous strokes with resolution of some areas. No significant large vessel disease is noted.   Clinical Impression   Pt is presenting to acute OT with above situation.  She has  decreased strength and ROM in LUE, with 0/5 palpated in LUE hand and forearm.  Pt may also be presenting with slight LUE neglect.  Educated pt on importance of maintaining visual contact with LUE for safety.  PT present for last portion of eval during t/f and assessment for d/c AD needs.  Pt will benefit from continued OT services - recommend HHOT at this time with 24-hour supervision. No equipment recommendations at this time.    Follow Up Recommendations  Home health OT;Supervision/Assistance - 24 hour    Equipment Recommendations  None recommended by OT    Recommendations for Other Services       Precautions / Restrictions Precautions Precautions: Fall Restrictions Weight Bearing Restrictions: No      Mobility Bed Mobility Overal bed mobility: Needs Assistance Bed Mobility: Supine to Sit     Supine to sit: Mod assist (max cueing) Sit to supine: Mod assist;Max assist   General bed mobility comments: Pt seated at EOB after OT evaluation when PT entered room  Transfers Overall transfer level: Needs assistance Equipment used: Rolling walker (2 wheeled);Left platform walker Transfers: Sit to/from Stand Sit to Stand: Mod assist         General transfer comment: Mod assist with walker with platform, max assist with hemi walker.  OT in room during assessment for Lt hand placement on Walker    Balance                                            ADL Overall  ADL's : Needs assistance/impaired Eating/Feeding: Set up Eating/Feeding Details (indicate cue type and reason): using RUE, max assist with bilateral tasks   Grooming Details (indicate cue type and reason): max assist with bilateral tasks             Lower Body Dressing: Maximal assistance   Toilet Transfer: Moderate assistance                   Vision     Ocular Range of Motion: Within Functional Limits Tracking/Visual Pursuits: Able to track stimulus in all quads without  difficulty   Convergence: Impaired - to be further tested in functional context         Perception     Praxis      Pertinent Vitals/Pain Pain Assessment: 0-10 Pain Score: 7  Pain Location: RLE Pain Intervention(s): Repositioned;Monitored during session (Pt reports RN provided pain meds prior to session)     Hand Dominance Right   Extremity/Trunk Assessment Upper Extremity Assessment Upper Extremity Assessment: LUE deficits/detail LUE Deficits / Details: Shoulder flexion 3-/5, shoulder extension 3+/5, shoulder abduction 3-/5, shoulder adduction 3+/5, elbow flexion 3-/5, elbow extension 3+/5, and 0/5 noted for digit/wrist/supination/pronation at this time.  Upone entering eval pt's LUE was nearly underneath pt and after t/f pt did not notice that LUE was situated behind her - slight LUE neglect possible (to be assessed further). Educated pt on importance of keeping LUE within eyesight and maintaining good elevation to prevent future edema. LUE Sensation:  (Pt reports good sensation, but to be further tested.) LUE Coordination: decreased fine motor;decreased gross motor   Lower Extremity Assessment Lower Extremity Assessment: Defer to PT evaluation       Communication Communication Communication: HOH   Cognition Arousal/Alertness: Awake/alert Behavior During Therapy: WFL for tasks assessed/performed Overall Cognitive Status: Within Functional Limits for tasks assessed                     General Comments       Exercises Exercises: General Lower Extremity     Shoulder Instructions      Home Living Family/patient expects to be discharged to:: Private residence Living Arrangements: Spouse/significant other (Family nearby) Available Help at Discharge: Family;Available 24 hours/day Type of Home: House Home Access: Ramped entrance     Home Layout: One level     Bathroom Shower/Tub: Tub/shower unit;Curtain   Firefighter: Standard     Home Equipment:  Wheelchair - power;Bedside commode;Hand held shower head;Tub bench;Walker - 4 wheels;Grab bars - tub/shower          Prior Functioning/Environment Level of Independence: Needs assistance  Gait / Transfers Assistance Needed: Using a rollator for short distances, ~25% of the day.  Pt reports she primarily uses her power W/C for mobility skills (chair fits into all rooms except bathroom).  ADL's / Homemaking Assistance Needed: Pt reports modified independence with dressing, bathing, laundry, and kitchen tasks.  Pt reports husband completes all shopping.        OT Diagnosis: Hemiplegia non-dominant side   OT Problem List: Decreased strength;Decreased activity tolerance;Impaired balance (sitting and/or standing);Decreased safety awareness;Decreased knowledge of use of DME or AE;Decreased coordination;Decreased range of motion;Impaired UE functional use   OT Treatment/Interventions: Self-care/ADL training;DME and/or AE instruction;Therapeutic activities;Patient/family education;Balance training;Neuromuscular education    OT Goals(Current goals can be found in the care plan section) Acute Rehab OT Goals Patient Stated Goal: Get LUE to work OT Goal Formulation: With patient Time For Goal Achievement: 08/12/14 Potential to  Achieve Goals: Fair ADL Goals Pt Will Perform Lower Body Dressing: with mod assist Pt Will Transfer to Toilet: with min assist;bedside commode Pt/caregiver will Perform Home Exercise Program: Left upper extremity;Increased strength;Increased ROM  OT Frequency: Min 2X/week   Barriers to D/C:            Co-evaluation              End of Session Equipment Utilized During Treatment: Rolling walker (LUE platform)  Activity Tolerance: Patient tolerated treatment well Patient left: in bed (with PT)   Time: [id number] OT Time Calculation (min): 36 min Charges:  OT General Charges $OT Visit: 1 Procedure OT Evaluation $Initial OT Evaluation Tier I: 1  Procedure G-Codes:     Marry GuanMarie Rawlings, MS, OTR/L [phone]  07/29/2014, 9:35 AM

## 2014-07-29 NOTE — Progress Notes (Signed)
Physical Therapy Treatment Patient Details Name: Alexandria Castillo MRN: [id number] DOB: 02/28/1929 Today's Date: 07/29/2014    History of Present Illness 80 year old female who has a past medical history of Hiatal hernia; Hypercholesterolemia; Fractured tibia; Hip pain; PE (pulmonary embolism); Complication of anesthesia; PONV (postoperative nausea and vomiting); Myocardial infarct; Stroke; and Coronary artery disease.  Patient presented with numbness and tingling of the left arm on 07/11/2014, at that time patient also had chest pain and STEMI underwent cardiac cath which showed 99% blockage at RCA which was stented with drug eluding stent, at the same time MRI brain showed embolic right brain infarcts thought to be likely due to acute MI. Patient was started on aspirin for stroke prevention and ticargrelor for STEMI.  Today patient presents with worsening weakness of the left arm along with numbness and tingling. The symptoms started last night, around 9:30 PM. She denies slurred speech, no blurry vision, did not pass out, no seizure-like activity. No chest pain or shortness of breath. Patient has been taking her medications as prescribed. She continues to have numbness tingling and weakness of the left arm. Patient has chronic left lower extremity pain after tibial fracture. She takes tramadol  50 mg 2 tablets 5 times a day.  The patient's brain MRI is reviewed and compared to the previous scan. There are new or increased signal seen on diffusion scan involving the temporal parietal lobe. There is also single lesion noted in the occipital lobe. These are all on the right side. These are multiple lesions in at least 2 vascular distribution. The MCA and PCA distribution on the right side. It appears to be extension of the previous strokes with resolution of some areas. No significant large vessel disease is noted.    PT Comments    Gait assessment completed today with OT in room for supervision of Lt hand to  determine appropriate AD for gait.  Gait assessment completed with RW with platform on the Lt and with hemi walker on the Rt.  Pt was unable to clear feet, and required increased assistance with sit -> stand transfer in attempt to use hemi walker.  Pt did require assistance to place UE on platform, though was able to maintain contact on platform during side stepping today.  Pt reports increased fatigue today compared to yesterday and is unable to amb forward, only sidestepping again today.  Pt was able to tolerate supine and seated exercises after gait assessment for improvement in LE strengthening.  Recommend use of platform walker with transfers and gait, and continued PT in the hospital to address strengthening and activity tolerance for improved functional mobility skills.   Follow Up Recommendations  Home health PT;Supervision/Assistance - 24 hour     Equipment Recommendations  Other (comment) (Platform walker)       Precautions / Restrictions Precautions Precautions: Fall Restrictions Weight Bearing Restrictions: No    Mobility  Bed Mobility Overal bed mobility: Needs Assistance Bed Mobility: Supine to Sit     Supine to sit: Mod assist (max cueing) Sit to supine: Mod assist;Max assist   General bed mobility comments: Pt seated at EOB after OT evaluation when PT entered room  Transfers Overall transfer level: Needs assistance Equipment used: Rolling walker (2 wheeled);Left platform walker Transfers: Sit to/from Stand Sit to Stand: Mod assist         General transfer comment: Mod assist with walker with platform, max assist with hemi walker.  OT in room during assessment for Lt hand placement  on Walker  Ambulation/Gait             General Gait Details: Pt was able to side step to the Rt with RW with platform, though unable to side step with hemi walker         Cognition Arousal/Alertness: Awake/alert Behavior During Therapy: WFL for tasks  assessed/performed Overall Cognitive Status: Within Functional Limits for tasks assessed                      Exercises General Exercises - Lower Extremity Long Arc Quad: 10 reps;AROM;Both;Seated Heel Slides: 10 reps;AROM;Both;Supine Toe Raises: 10 reps;AROM;Both;Seated Heel Raises: 10 reps;AROM;Both;Seated        Pertinent Vitals/Pain Pain Assessment: 0-10 Pain Score: 7  Pain Location: RLE Pain Intervention(s): Repositioned;Monitored during session (Pt reports RN provided pain meds prior to session)    Home Living Family/patient expects to be discharged to:: Private residence Living Arrangements: Spouse/significant other (Family nearby) Available Help at Discharge: Family;Available 24 hours/day Type of Home: House Home Access: Ramped entrance   Home Layout: One level Home Equipment: Wheelchair - power;Bedside commode;Hand held shower head;Tub bench;Walker - 4 wheels;Grab bars - tub/shower      Prior Function Level of Independence: Needs assistance  Gait / Transfers Assistance Needed: Using a rollator for short distances, ~25% of the day.  Pt reports she primarily uses her power W/C for mobility skills (chair fits into all rooms except bathroom).  ADL's / Homemaking Assistance Needed: Pt reports modified independence with dressing, bathing, laundry, and kitchen tasks.  Pt reports husband completes all shopping.     PT Goals (current goals can now be found in the care plan section) Acute Rehab PT Goals Patient Stated Goal: Get LUE to work Progress towards PT goals: Progressing toward goals    Frequency  Min 3X/week    PT Plan Current plan remains appropriate       End of Session Equipment Utilized During Treatment: Gait belt Activity Tolerance: Patient limited by fatigue Patient left: in bed;with call bell/phone within reach;with bed alarm set     Time: 0900-0920 PT Time Calculation (min): 20 min  Charges:  $Therapeutic Exercise: 8-22 mins                      Carter Kaman 07/29/2014, 9:34 AM

## 2014-07-29 NOTE — Progress Notes (Signed)
  Echocardiogram 2D Echocardiogram has been performed.  Alexandria Castillo 07/29/2014, 4:47 PM 

## 2014-07-29 NOTE — Progress Notes (Signed)
PROGRESS NOTE  Alexandria Castillo RUE:[id number] DOB: 1929-05-01 DOA: 07/27/2014 PCP: Kathlee Nations, MD  Summary: 80 year old woman hospitalized less than 2 weeks ago with numbness and tingling of the left arm as well as chest pain. Underwent cath for STEMI, 99% RCA blockage treated with stent, at same time MRI brain revealed embolic right brain infarcts thought to be secondary to acute MI. She was started on aspirin.  She presented to [facility] 8/10 with complaints of left upper extremity weakness and paresthesias.  She was recently in the hospital the end of July. At that time she was admitted for trouble with arm weakness. She eventually had an MRI of the brain showing right frontal and parietal strokes. While in the hospital she developed acute pain in her arm and had an EKG showing ST elevation MI. She underwent cardiac catheterization and had a stent placed.  Assessment/Plan: Multiple stroke involving the right hemisphere in 2 or 3 vascular distribution:  Left upper extremity weakness with pronator drift. Highly suspicious for recurrent stroke.  S/p embolic right brain infarcts 12/4780 thought secondary to acute MI S/p STEMI with PCI/stent 06/2014. asymptomatic. No chest pain. Troponin negative. EKG without acute changes. -appreciate neurology -Plan for TEE today -holter monitor as an outpatient  -carotids showing no hemodynamically significant ICA   -Continue ASA and Brilinta -Keep outpatient followup with Dr. Roda Shutters at vascular neurologic clinic in 4 weeks  Code Status: full code DVT prophylaxis: Lovenox Family Communication: husband, son, daughter Disposition Plan: home  Marlin Canary DO  [facility]  Pager [phone] If 7PM-7AM, please contact night-coverage at [email or web address], password [facility] 07/29/2014, 10:46 AM  LOS: 2 days   Consultants:  Neurology  Procedures:    Antibiotics:    HPI/Subjective: Patient is currently NPO, reporting ongoing left upper extremity weakness.    Objective: Filed Vitals:   07/27/14 2342 07/28/14 0430 07/28/14 2052 07/29/14 0519  BP: 117/80 131/54 143/65 142/66  Pulse: 82 74 76 77  Temp: 98.1 F (36.7 C) 98.1 F (36.7 C) 98.9 F (37.2 C) 98.1 F (36.7 C)  TempSrc: Oral Oral Oral Oral  Resp: 20 18 20 20   Height:      Weight:    86.4 kg (190 lb 7.6 oz)  SpO2: 97% 96% 96% 94%    Intake/Output Summary (Last 24 hours) at 07/29/14 1046 Last data filed at 07/29/14 0800  Gross per 24 hour  Intake    330 ml  Output      0 ml  Net    330 ml     Filed Weights   07/27/14 0305 07/27/14 0621 07/29/14 0519  Weight: 87.091 kg (192 lb) 87.6 kg (193 lb 2 oz) 86.4 kg (190 lb 7.6 oz)    Exam:     AF VSS Gen. Appears calm and comfortable  Psych. Speech fluent and clear  Cardiovascular. Regular rate and rhythm. Telemetry sinus rhythm. No murmur, rub gallop. Plus bilateral lower extremity edema.  Respiratory. Clear to auscultation bilaterally. No wheezes, rales or rhonchi. Normal respiratory effort.  Musculoskeletal. Bilateral lower extremity strength appears grossly normal and symmetric. Right upper extremity strength appears grossly normal. The left upper extremity significantly weak, unable to raise arm more than a foot or so of the bed. Very weak grip. Unable to touch her nose.  Neurologic. Cranial nerves appear intact. Has 2/5 muscle strength to left upper extremity, 5/5 muscle strength to lower extremities bilaterally.     Scheduled Meds: . antiseptic oral rinse  7 mL Mouth Rinse BID  .  aspirin EC  81 mg Oral Daily  . atorvastatin  10 mg Oral q1800  . enoxaparin (LOVENOX) injection  40 mg Subcutaneous Q24H  . sodium chloride  3 mL Intravenous Q12H  . sodium chloride  3 mL Intravenous Q12H  . ticagrelor  90 mg Oral BID   Continuous Infusions:   Principal Problem:   Left arm weakness Active Problems:   CAD S/P percutaneous coronary angioplasty: Promus Premier DES   Presence of drug coated stent in right  coronary artery   Stroke   Time spent 25 minutes

## 2014-07-30 ENCOUNTER — Inpatient Hospital Stay (HOSPITAL_COMMUNITY): Payer: Medicare PPO

## 2014-07-30 ENCOUNTER — Other Ambulatory Visit: Payer: Self-pay

## 2014-07-30 ENCOUNTER — Encounter: Payer: Medicare PPO | Admitting: Cardiovascular Disease

## 2014-07-30 DIAGNOSIS — I639 Cerebral infarction, unspecified: Secondary | ICD-10-CM

## 2014-07-30 MED ORDER — TEMAZEPAM 15 MG PO CAPS: 15.0000 mg | ORAL_CAPSULE | Freq: Every evening | ORAL | Status: AC | PRN

## 2014-07-30 NOTE — Care Management Note (Addendum)
Page 1 of 2   07/31/2014     5:19:40 PM CARE MANAGEMENT NOTE 07/31/2014  Patient:  Alexandria Castillo,Alexandria Castillo   Account Number:  [id number]  Date Initiated:  07/27/2014  Documentation initiated by:  Anibal HendersonBOLDEN,Hulan Szumski  Subjective/Objective Assessment:   Admitted with ?CVA. Had recent CVA and STEMI, and [facility]. Pt is from home with spouse, and will return home at D/C She has [facility] active in the home ([place] and would like to continue with them     Action/Plan:   Will notify [facility] of D/C and obtain resume HH orders at D/C. Pt has a walker- will follow for other needs. Awaiting other testing for complete Dx and PT/OT consults   Anticipated DC Date:  07/29/2014   Anticipated DC Plan:  HOME W HOME HEALTH SERVICES      DC Planning Services  CM consult      [facility]  Resumption Of Svcs/PTA Provider   Choice offered to / List presented to:  C-1 Patient        HH arranged  HH-1 RN  HH-10 DISEASE MANAGEMENT  HH-2 PT      HH agency  [facility]   Status of service:  Completed, signed off Medicare Important Message given?  YES (If response is "NO", the following Medicare IM given date fields will be blank) Date Medicare IM given:  07/30/2014 Medicare IM given by:  Anibal HendersonBOLDEN,Augustin Bun Date Additional Medicare IM given:   Additional Medicare IM given by:    Discharge Disposition:  HOME W HOME HEALTH SERVICES  Per UR Regulation:  Reviewed for med. necessity/level of care/duration of stay  If discussed at Long Length of Stay Meetings, dates discussed:    Comments:  07/31/14 1500 Anibal HendersonGeneva Asif Muchow RN/CM Telephone call from daughter Marquette Oldhyllis Gann. She is upset that her mother was sent home, and feels she should have gone to a SNF for rehab. When CM spoke with patient both 8/10 and 8/13 spouse was in the room and pt was alert and oriented. She was asked what her plans were for when it came time for D/C "was she going back home or did she feel she  needed skilled care for a short time?  Patient said she was from home and was  returning home at discharge. She said she had [facility] HH  already coming and they could keep coming to help. Spouse agreed with this.  Daughter wanted to know why we did not talk with family when pt said she was going back home?  Explained that if a pt is alert and oriented, and tells us what he or she wants, we only involve family if they are in  the room and pt gives permission,  or if they call and pt agrees we can speak with them and then it still the patient's choice of where to go if alert and oriented. PT and OT saw pt and recommended HH,  not SNF, also.  Daughter is trying to get pt into a facility called [place] in [place] near her mom. [facility] is helping as much as possible, but they do not have a Child psychotherapistsocial worker. Pt wants records sent to this facility explained that she will need to do records request to medical records/ information management, asnd her Mom with have to sign for this, and the SNF can send this in and have records sent to them. Daughter does say that she expressed concern to D/C nurse  that her mom could not transfer well, and should not be going home,  while trying to get her to the W/C and then into the car, but no one did anything Called [facility] due to PT had recommended a platform walker, because pt having more difficulty using the regular walker. They will have this sent out if pt stays at home, and does not go to SNF.     07/30/14 1645 Anibal Henderson RN/CM pt D/C home with [facility] to resume and with Holter monitor placed in cardiology at D/C  07/27/14 1330 [facility] RN/CM

## 2014-07-30 NOTE — Progress Notes (Signed)
Patient being d/c home with family. IV cath removed and intact. Verbalizes understanding. Follow up appointments scheduled. No c/o pain at this time.No pain/swelling at site.

## 2014-07-30 NOTE — Discharge Summary (Addendum)
Physician Discharge Summary  Alexandria Castillo ZOX:[id number] DOB: 1929-10-30 DOA: 07/27/2014  PCP: Kathlee Nations, MD  Admit date: 07/27/2014 Discharge date: 07/30/2014  Time spent: 35 minutes  Recommendations for Outpatient Follow-up:  1. Patient discharged with a 4 week event monitor, please follow up   Discharge Diagnoses:  Principal Problem:   Left arm weakness Active Problems:   CAD S/P percutaneous coronary angioplasty: Promus Premier DES   Presence of drug coated stent in right coronary artery   Stroke   Discharge Condition: Stable/Improved  Diet recommendation: Heart Healthy Diet  Filed Weights   07/27/14 0305 07/27/14 0621 07/29/14 0519  Weight: 87.091 kg (192 lb) 87.6 kg (193 lb 2 oz) 86.4 kg (190 lb 7.6 oz)    History of present illness:  80 year old female who has a past medical history of Hiatal hernia; Hypercholesterolemia; Fractured tibia; Hip pain; PE (pulmonary embolism); Complication of anesthesia; PONV (postoperative nausea and vomiting); Myocardial infarct; Stroke; and Coronary artery disease.  Patient presented with numbness and tingling of the left arm on 07/11/2014, at that time patient also had chest pain and STEMI underwent cardiac cath which showed 99% blockage at RCA which was stented with drug eluding stent, at the same time MRI brain showed embolic right brain infarcts thought to be likely due to acute MI. Patient was started on aspirin for stroke prevention and  ticargrelor for STEMI.  Today patient presents with worsening weakness of the left arm along with numbness and tingling. The symptoms started last night, around 9:30 PM. She denies slurred speech, no blurry vision, did not pass out, no seizure-like activity. No chest pain or shortness of breath. Patient has been taking her medications as prescribed. She continues to have numbness tingling and weakness of the left arm. Patient has chronic left lower extremity pain after tibial fracture. She takes tramadol  50 mg 2 tablets 5 times a day.   Hospital Course:  Patient is a pleasant 80 year old female with a past medical history of coronary artery disease, status post cardiac catheterization 07/12/2014 that showed a mid RCA 99% lesion, with percutaneous intervention, history of CVA, who was admitted to the medicine service on 07/27/2014. She initially presented with complaints of left arm weakness. There was concern for the possibility of acute CVA given her history. Cardiology and neurology were consulted. MRI of brain performed on 07/27/2014 showing progressive right MCA territory infarct involving pursue right frontal lobe and anterior right parietal lobe without hemorrhage. There was a new 5 mm acute nonhemorrhagic infarct within the right occipital lobe. There was concern that this could be a consequence of a thromboembolic phenomenon. She underwent a transesophageal echocardiogram that was performed on 07/29/2014, not showing evidence of intracardiac thrombus. Neurology recommended a 4 week event monitor/Holter monitor. She was continued on dual agent antiplatelet therapy with Brilinta and aspirin. Patient otherwise remaining stable, was discharged to her home on 07/30/2014. Home Health Services were resumed.  Procedures:  Transesophageal echocardiogram performed on 07/29/2014 impression: Estimated ejection fraction 50-55%, no evidence of thrombus in the usual cavity or appendage. No defect or patent foramen of ovale identified. Contrast study showed no right-to-left atrial level shunt.  Consultations:  Cardiology  Neurology  Discharge Exam: Filed Vitals:   07/30/14 0537  BP: 150/72  Pulse: 70  Temp: 98 F (36.7 C)  Resp: 18    General: Patient is in no acute distress, awake alert and oriented. Cardiovascular: Regular rate and rhythm normal S1 and S2 no murmurs rubs gallops, trace edema to lower  extremities bilaterally. Respiratory: Normal respiratory effort, lungs overall clear to  auscultation Abdomen: Soft nontender nondistended Neurological: Patient having 2/5 muscle strength to her left upper extremity, 5 of 5 muscle strength to left lower and right lower extremities.  Discharge Instructions You were cared for by a hospitalist during your hospital stay. If you have any questions about your discharge medications or the care you received while you were in the hospital after you are discharged, you can call the unit and asked to speak with the hospitalist on call if the hospitalist that took care of you is not available. Once you are discharged, your primary care physician will handle any further medical issues. Please note that NO REFILLS for any discharge medications will be authorized once you are discharged, as it is imperative that you return to your primary care physician (or establish a relationship with a primary care physician if you do not have one) for your aftercare needs so that they can reassess your need for medications and monitor your lab values.  Discharge Instructions   Call MD for:  difficulty breathing, headache or visual disturbances    Complete by:  As directed      Call MD for:  extreme fatigue    Complete by:  As directed      Call MD for:  persistant dizziness or light-headedness    Complete by:  As directed      Call MD for:  persistant nausea and vomiting    Complete by:  As directed      Call MD for:  redness, tenderness, or signs of infection (pain, swelling, redness, odor or green/yellow discharge around incision site)    Complete by:  As directed      Call MD for:  severe uncontrolled pain    Complete by:  As directed      Call MD for:  temperature >100.4    Complete by:  As directed      Diet - low sodium heart healthy    Complete by:  As directed      Increase activity slowly    Complete by:  As directed             Medication List    STOP taking these medications       cefdinir 300 MG capsule  Commonly known as:  OMNICEF      meloxicam 7.5 MG tablet  Commonly known as:  MOBIC      TAKE these medications       aspirin 81 MG tablet  Take 1 tablet (81 mg total) by mouth daily.     atorvastatin 10 MG tablet  Commonly known as:  LIPITOR  Take 1 tablet (10 mg total) by mouth daily at 6 PM.     CALTRATE 600 PLUS-VIT D PO  Take 1 tablet by mouth daily.     Fish Oil 1200 MG Caps  Take 1,200 mg by mouth daily.     metoprolol succinate 25 MG 24 hr tablet  Commonly known as:  TOPROL-XL  Take 12.5 mg by mouth daily.     nystatin cream  Commonly known as:  MYCOSTATIN  Apply 1 application topically daily as needed (rash).     ramipril 2.5 MG capsule  Commonly known as:  ALTACE  Take 1 capsule (2.5 mg total) by mouth daily.     temazepam 15 MG capsule  Commonly known as:  RESTORIL  Take 1 capsule (15 mg total) by mouth at  bedtime as needed for sleep.     ticagrelor 90 MG Tabs tablet  Commonly known as:  BRILINTA  Take 1 tablet (90 mg total) by mouth 2 (two) times daily.     traMADol 50 MG tablet  Commonly known as:  ULTRAM  Take 50-100 mg by mouth every 6 (six) hours as needed for moderate pain or severe pain. Pain.     vitamin B-12 500 MCG tablet  Commonly known as:  CYANOCOBALAMIN  Take 500 mcg by mouth daily.     Vitamin D (Ergocalciferol) 50000 UNITS Caps capsule  Commonly known as:  DRISDOL  Take 50,000 Units by mouth every 7 (seven) days. Take on Monday.       Allergies  Allergen Reactions  . Ciprofloxacin Other (See Comments)    unknown  . Decadron [Dexamethasone] Other (See Comments)    unknown  . Imipramine Other (See Comments)    Patient states "I felt awful"  . Oxycodone Other (See Comments)    unknown  . Premarin [Conjugated Estrogens] Other (See Comments)    unknown  . Synthroid [Levothyroxine] Other (See Comments)    unknown  . Baycol [Cerivastatin] Rash       Follow-up Information   Follow up with EASON,[name], MD In 2 weeks.   Specialty:  Internal Medicine    Contact information:   [address] [place] [address] [phone]       Follow up with [facility], KOFI, MD In 2 weeks.   Specialty:  Neurology   Contact information:   [address] [place] [address] [phone]        The results of significant diagnostics from this hospitalization (including imaging, microbiology, ancillary and laboratory) are listed below for reference.    Significant Diagnostic Studies: Dg Chest 2 View  07/11/2014   CLINICAL DATA:  Stroke.  EXAM: CHEST  2 VIEW  COMPARISON:  Chest x-ray 10/23/2012.  FINDINGS: Lung volumes are normal. No consolidative airspace disease. No pleural effusions. No pneumothorax. No evidence of pulmonary edema. Mild cardiomegaly. Upper mediastinal contours are within normal limits. Atherosclerosis in the thoracic aorta.  IMPRESSION: 1. No radiographic evidence of acute cardiopulmonary disease. 2. Mild cardiomegaly. 3. Atherosclerosis.   Electronically Signed   By: Trudie Reed M.D.   On: 07/11/2014 10:42   Ct Head Wo Contrast  07/27/2014   CLINICAL DATA:  Left arm pain.  Weakness.  EXAM: CT HEAD WITHOUT CONTRAST  TECHNIQUE: Contiguous axial images were obtained from the base of the skull through the vertex without intravenous contrast.  COMPARISON:  CT of the head performed 07/11/2014, and MRI of the brain from 07/14/2014  FINDINGS: There is no evidence of acute infarction, mass lesion, or intra- or extra-axial hemorrhage on CT.  Prominence of the ventricles and sulci reflects mild cortical volume loss. Cerebellar atrophy is noted.  The brainstem and fourth ventricle are within normal limits. The basal ganglia are unremarkable in appearance. The cerebral hemispheres demonstrate grossly normal gray-white differentiation. No mass effect or midline shift is seen.  There is no evidence of fracture; visualized osseous structures are unremarkable in appearance. The orbits are within normal limits. The paranasal sinuses and  mastoid air cells are well-aerated. No significant soft tissue abnormalities are seen.  IMPRESSION: 1. No acute intracranial pathology seen on CT. 2. Mild cortical volume loss noted.   Electronically Signed   By: Roanna Raider M.D.   On: 07/27/2014 04:16   Ct Head Wo Contrast  07/11/2014   CLINICAL  DATA:  Code stroke. Left arm and elbow numbness and pain. Weakness.  EXAM: CT HEAD WITHOUT CONTRAST  TECHNIQUE: Contiguous axial images were obtained from the base of the skull through the vertex without intravenous contrast.  COMPARISON:  None.  FINDINGS: There is no evidence of acute infarction, mass lesion, or intra- or extra-axial hemorrhage on CT.  Prominence of the ventricles and sulci reflects mild cortical volume loss. Mild cerebellar atrophy is noted. Mild periventricular white matter change likely reflects small vessel ischemic microangiopathy.  The brainstem and fourth ventricle are within normal limits. The basal ganglia are unremarkable in appearance. The cerebral hemispheres demonstrate grossly normal gray-white differentiation. No mass effect or midline shift is seen.  There is no evidence of fracture; visualized osseous structures are unremarkable in appearance. The orbits are within normal limits. The paranasal sinuses and mastoid air cells are well-aerated. No significant soft tissue abnormalities are seen.  IMPRESSION: 1. No acute intracranial pathology seen on CT. 2. Mild cortical volume loss and scattered small vessel ischemic microangiopathy.  These results were called by telephone at the time of interpretation on 07/11/2014 at 3:18 am to Dr. Paula Libra, who verbally acknowledged these results.   Electronically Signed   By: Roanna Raider M.D.   On: 07/11/2014 03:19   Mr Maxine Glenn Head Wo Contrast  07/27/2014   CLINICAL DATA:  CVA.  EXAM: MRA HEAD WITHOUT CONTRAST  TECHNIQUE: Angiographic images of the Circle of Willis were obtained using MRA technique without intravenous contrast.  COMPARISON:  MRI  brain from the same day. MRA circle of Willis 07/14/2014.  FINDINGS: The cervical right internal carotid artery is tortuous. Mild atherosclerotic changes are again noted within the cavernous carotid arteries without significant stenosis. There is focal signal loss in the distal right A1 segment which may be artifactual. The anterior communicating artery is patent. The upper portion of the study is degraded by patient motion, exaggerating small vessel disease. There is asymmetric signal loss in the proximal right A2 segment. The MCA bifurcations are intact. There is significant scattered small vessel attenuation bilaterally.  The vertebral arteries are codominant. The right PICA origin is visualized and normal. The left AICA is dominant. The basilar artery is within normal limits. There is mild signal loss in the proximal right posterior cerebral artery. Moderate signal loss is noted in the PCA branch vessels bilaterally.  IMPRESSION: 1. Mild atherosclerotic changes within the cavernous carotid arteries bilaterally. No significant proximal lesion to account for the right MCA territory infarcts. 2. No significant proximal stenosis, aneurysm, or branch vessel occlusion. 3. Signal loss the proximal right A2 segment and right P2 segment may be exaggerated by patient motion. 4. Small branch vessel attenuation is exaggerated diffusely compared to the prior study, likely due to patient motion.   Electronically Signed   By: Gennette Pac M.D.   On: 07/27/2014 11:27   Mr Brain Wo Contrast  07/27/2014   CLINICAL DATA:  Left arm pain and weakness.  CVA.  EXAM: MRI HEAD WITHOUT CONTRAST  TECHNIQUE: Multiplanar, multiecho pulse sequences of the brain and surrounding structures were obtained without intravenous contrast.  COMPARISON:  CT head without contrast 07/27/2014. MRI brain without contrast 07/14/2014.  FINDINGS: The diffusion-weighted images demonstrate progression of restricted diffusion an acute nonhemorrhagic  infarct within the posterior right frontal lobe along the prior motor cortex and within the subjacent white matter. A 5 mm linear area of acute infarction is noted within the right occipital lobe, new from the prior exam. Additional  more anterior right frontal lobe infarcts are no longer visible on the diffusion sequence.  T2 changes are associated with the progressive right MCA territory infarcts. Periventricular and brainstem white matter changes are stable.  No hemorrhage or mass lesion is present. The ventricles are of normal size. No significant extraaxial fluid collection is present.  Flow is present in the major intracranial arteries. The globes and orbits are intact. Paranasal sinuses and mastoid air cells are clear. A relatively empty sella is stable. The craniocervical junction is within normal limits.  IMPRESSION: 1. Progressive right MCA territory infarcts involving the posterior right frontal lobe and anterior right parietal lobe without hemorrhage. 2. New 5 mm acute nonhemorrhagic infarct within the right occipital lobe. 3. Minimal periventricular white matter changes are otherwise stable.   Electronically Signed   By: Gennette Pachris  Mattern M.D.   On: 07/27/2014 11:24   Mr Brain Wo Contrast  07/14/2014   CLINICAL DATA:  Stroke. New onset left upper extremity numbness and tingling.  EXAM: MRI HEAD WITHOUT CONTRAST  MRA HEAD WITHOUT CONTRAST  TECHNIQUE: Multiplanar, multiecho pulse sequences of the brain and surrounding structures were obtained without intravenous contrast. Angiographic images of the head were obtained using MRA technique without contrast.  COMPARISON:  Head CT 07/11/2014  FINDINGS: MRI HEAD FINDINGS  There are multiple punctate foci cortical restricted diffusion in the right frontal and parietal lobes, predominantly involving the precentral and postcentral gyri, with some additional small foci noted more anteriorly in the right frontal lobe as well as more inferiorly in the right parietal  and posterior right temporal lobes. A punctate focus of susceptibility artifact in the region of the left external capsule is compatible with remote microhemorrhage. There is moderate cerebral atrophy. There is no mass, midline shift, or extra-axial fluid collection.  Orbits are unremarkable. Frontal sinuses are hypoplastic. There is minimal bilateral anterior ethmoid air cell and maxillary sinus mucosal thickening. Mastoid air cells are clear. Major intracranial vascular flow voids are preserved. There is grade 1 anterolisthesis of C3 on C4.  MRA HEAD FINDINGS  Visualized distal vertebral arteries are patent and codominant. PICA, AICA, and SCA origins are patent. Basilar artery is patent without stenosis. PCAs are unremarkable aside from mild distal branch vessel irregularity and attenuation.  Internal carotid arteries are patent from skullbase to carotid termini. ACAs are unremarkable. MCAs are unremarkable aside from minimal distal branch vessel irregularity. Anterior communicating artery is patent. No intracranial aneurysm is identified.  IMPRESSION: 1. Multiple punctate cortical infarcts in the right MCA territory, predominantly in the right frontal and parietal lobes. 2. No evidence of major intracranial arterial occlusion or significant proximal stenosis. Very mild distal branch vessel irregularity may reflect mild atherosclerotic change.   Electronically Signed   By: Sebastian AcheAllen  Grady   On: 07/14/2014 09:48   [place] Carotid Bilateral  07/28/2014   CLINICAL DATA:  Small acute stroke, syncope, visual disturbance, hyperlipidemia  EXAM: BILATERAL CAROTID DUPLEX ULTRASOUND  TECHNIQUE: Wallace CullensGray scale imaging, color Doppler and duplex ultrasound were performed of bilateral carotid and vertebral arteries in the neck.  COMPARISON:  07/27/2014  FINDINGS: Criteria: Quantification of carotid stenosis is based on velocity parameters that correlate the residual internal carotid diameter with NASCET-based stenosis levels, using the  diameter of the distal internal carotid lumen as the denominator for stenosis measurement.  The following velocity measurements were obtained:  RIGHT  ICA:  115/23 cm/sec  CCA:  85/13 cm/sec  SYSTOLIC ICA/CCA RATIO:  1.35  DIASTOLIC ICA/CCA RATIO:  1.80  ECA:  112 cm/sec  LEFT  ICA:  77/16 cm/sec  CCA:  85/9 cm/sec  SYSTOLIC ICA/CCA RATIO:  0.90  DIASTOLIC ICA/CCA RATIO:  1.90  ECA:  66 cm/sec  RIGHT CAROTID ARTERY: Minor echogenic shadowing plaque formation. No hemodynamically significant right ICA stenosis, velocity elevation, or turbulent flow. Degree of narrowing less than 50%.  RIGHT VERTEBRAL ARTERY:  Antegrade  LEFT CAROTID ARTERY: Similar scattered minor echogenic plaque formation. No hemodynamically significant left ICA stenosis, velocity elevation, or turbulent flow.  LEFT VERTEBRAL ARTERY:  Antegrade  IMPRESSION: Minor carotid atherosclerosis. No hemodynamically significant ICA stenosis by ultrasound. Degree of narrowing less than 50% bilaterally   Electronically Signed   By: Ruel Favors M.D.   On: 07/28/2014 09:47   US Venous Img Lower Unilateral Left  07/30/2014   CLINICAL DATA:  Left leg pain and swelling  EXAM: Left LOWER EXTREMITY VENOUS DOPPLER ULTRASOUND  TECHNIQUE: Gray-scale sonography with graded compression, as well as color Doppler and duplex ultrasound were performed to evaluate the lower extremity deep venous systems from the level of the common femoral vein and including the common femoral, femoral, profunda femoral, popliteal and calf veins including the posterior tibial, peroneal and gastrocnemius veins when visible. The superficial great saphenous vein was also interrogated. Spectral Doppler was utilized to evaluate flow at rest and with distal augmentation maneuvers in the common femoral, femoral and popliteal veins.  COMPARISON:  None.  FINDINGS: Common Femoral Vein: No evidence of thrombus. Normal compressibility, respiratory phasicity and response to augmentation.   Saphenofemoral Junction: No evidence of thrombus. Normal compressibility and flow on color Doppler imaging.  Profunda Femoral Vein: No evidence of thrombus. Normal compressibility and flow on color Doppler imaging.  Femoral Vein: No evidence of thrombus. Normal compressibility, respiratory phasicity and response to augmentation.  Popliteal Vein: No evidence of thrombus. Normal compressibility, respiratory phasicity and response to augmentation.  Calf Veins: No evidence of thrombus. Normal compressibility and flow on color Doppler imaging.  Superficial Great Saphenous Vein: No evidence of thrombus. Normal compressibility and flow on color Doppler imaging.  Venous Reflux:  None.  Other Findings:  None.  IMPRESSION: No evidence of deep venous thrombosis.   Electronically Signed   By: Alcide Clever M.D.   On: 07/30/2014 11:08   Dg Chest Portable 1 View  07/27/2014   CLINICAL DATA:  Left arm pain and weakness.  EXAM: PORTABLE CHEST - 1 VIEW  COMPARISON:  Chest radiograph performed 07/11/2014  FINDINGS: The lungs are well expanded. Vascular congestion is noted, with mildly increased interstitial markings, possibly reflecting minimal interstitial edema. No pleural effusion or pneumothorax is seen.  The cardiomediastinal silhouette is borderline normal in size. No acute osseous abnormalities are identified.  IMPRESSION: Vascular congestion, with mildly increased interstitial markings, possibly reflecting minimal interstitial edema.   Electronically Signed   By: Roanna Raider M.D.   On: 07/27/2014 04:13   Mr Maxine Glenn Head/brain Wo Cm  07/14/2014   CLINICAL DATA:  Stroke. New onset left upper extremity numbness and tingling.  EXAM: MRI HEAD WITHOUT CONTRAST  MRA HEAD WITHOUT CONTRAST  TECHNIQUE: Multiplanar, multiecho pulse sequences of the brain and surrounding structures were obtained without intravenous contrast. Angiographic images of the head were obtained using MRA technique without contrast.  COMPARISON:  Head CT  07/11/2014  FINDINGS: MRI HEAD FINDINGS  There are multiple punctate foci cortical restricted diffusion in the right frontal and parietal lobes, predominantly involving the precentral and postcentral gyri, with some additional small foci noted more  anteriorly in the right frontal lobe as well as more inferiorly in the right parietal and posterior right temporal lobes. A punctate focus of susceptibility artifact in the region of the left external capsule is compatible with remote microhemorrhage. There is moderate cerebral atrophy. There is no mass, midline shift, or extra-axial fluid collection.  Orbits are unremarkable. Frontal sinuses are hypoplastic. There is minimal bilateral anterior ethmoid air cell and maxillary sinus mucosal thickening. Mastoid air cells are clear. Major intracranial vascular flow voids are preserved. There is grade 1 anterolisthesis of C3 on C4.  MRA HEAD FINDINGS  Visualized distal vertebral arteries are patent and codominant. PICA, AICA, and SCA origins are patent. Basilar artery is patent without stenosis. PCAs are unremarkable aside from mild distal branch vessel irregularity and attenuation.  Internal carotid arteries are patent from skullbase to carotid termini. ACAs are unremarkable. MCAs are unremarkable aside from minimal distal branch vessel irregularity. Anterior communicating artery is patent. No intracranial aneurysm is identified.  IMPRESSION: 1. Multiple punctate cortical infarcts in the right MCA territory, predominantly in the right frontal and parietal lobes. 2. No evidence of major intracranial arterial occlusion or significant proximal stenosis. Very mild distal branch vessel irregularity may reflect mild atherosclerotic change.   Electronically Signed   By: Sebastian Ache   On: 07/14/2014 09:48    Microbiology: No results found for this or any previous visit (from the past 240 hour(s)).   Labs: Basic Metabolic Panel:  Recent Labs Lab 07/27/14 0300  07/27/14 0333 07/29/14 0605  NA 134* 135* 139  K 3.9 3.6* 3.9  CL 98 99 102  CO2 24  --  24  GLUCOSE 112* 112* 90  BUN 15 15 11   CREATININE 1.18* 1.20* 0.99  CALCIUM 9.6  --  9.5   Liver Function Tests:  Recent Labs Lab 07/27/14 0300  AST 15  ALT 8  ALKPHOS 76  BILITOT 0.4  PROT 6.8  ALBUMIN 3.5   No results found for this basename: LIPASE, AMYLASE,  in the last 168 hours No results found for this basename: AMMONIA,  in the last 168 hours CBC:  Recent Labs Lab 07/27/14 0300 07/27/14 0333 07/29/14 0605  WBC 10.4  --  10.1  NEUTROABS 7.2  --   --   HGB 12.9 13.6 12.9  HCT 37.9 40.0 37.9  MCV 88.1  --  88.6  PLT 234  --  238   Cardiac Enzymes:  Recent Labs Lab 07/27/14 0658 07/27/14 1207 07/27/14 1809  TROPONINI <0.30 <0.30 <0.30   BNP: BNP (last 3 results) No results found for this basename: PROBNP,  in the last 8760 hours CBG:  Recent Labs Lab 07/28/14 0746  GLUCAP [id number]       Signed:  Darcey Demma  [facility] 07/30/2014, 1:38 PM

## 2014-07-31 DIAGNOSIS — I6789 Other cerebrovascular disease: Secondary | ICD-10-CM

## 2014-08-04 ENCOUNTER — Encounter (HOSPITAL_COMMUNITY): Payer: Self-pay | Admitting: Cardiology

## 2014-08-26 ENCOUNTER — Ambulatory Visit: Payer: Self-pay | Admitting: Neurology

## 2014-08-31 ENCOUNTER — Telehealth: Payer: Self-pay | Admitting: *Deleted

## 2014-08-31 NOTE — Telephone Encounter (Signed)
Received EOS report. In Dr. Wyline Mood folder

## 2014-09-02 ENCOUNTER — Other Ambulatory Visit: Payer: Self-pay | Admitting: *Deleted

## 2014-09-02 ENCOUNTER — Telehealth: Payer: Self-pay | Admitting: *Deleted

## 2014-09-02 DIAGNOSIS — I639 Cerebral infarction, unspecified: Secondary | ICD-10-CM

## 2014-09-02 NOTE — Telephone Encounter (Signed)
Notified pt of event monitor normal rhythm, understood

## 2014-09-07 ENCOUNTER — Telehealth: Payer: Self-pay | Admitting: *Deleted

## 2014-09-07 NOTE — Telephone Encounter (Signed)
Notified pt daughter, forwarded to Dr. Maryellen Pile

## 2014-09-07 NOTE — Telephone Encounter (Signed)
Message copied by Vernon Prey on Mon Sep 07, 2014  4:11 PM ------      Message from: Kreamer F      Created: Mon Sep 07, 2014  1:04 PM       Please let patient know that her heart monitor show no significant abnormalities. Please forward results to her pcp.             Dominga Ferry MD ------

## 2014-11-26 ENCOUNTER — Encounter (HOSPITAL_COMMUNITY): Payer: Self-pay | Admitting: Cardiology

## 2015-08-06 ENCOUNTER — Other Ambulatory Visit: Payer: Self-pay | Admitting: Physician Assistant

## 2016-01-06 ENCOUNTER — Other Ambulatory Visit: Payer: Self-pay | Admitting: Cardiology

## 2016-01-10 IMAGING — MR MR HEAD W/O CM
6 of 11 series · 23 of 48 positions shown · non-contrast
Comparison: CT head without contrast 07/27/2014. MRI brain without
contrast 07/14/2014.

CLINICAL DATA: Left arm pain and weakness.  CVA.

EXAM:
MRI HEAD WITHOUT CONTRAST
TECHNIQUE: Multiplanar, multiecho pulse sequences of the brain and surrounding
structures were obtained without intravenous contrast.

[Series 4: T1 · sagittal · 5.0mm · 0.39mm/px · 3 of 20 slices shown (1 of 3)]
[im 1/20]
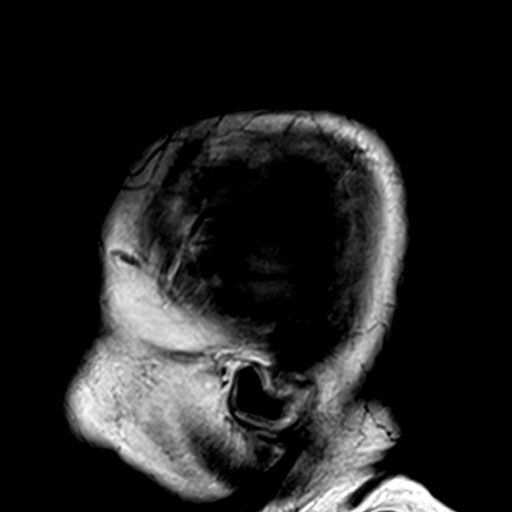
[im 10/20]
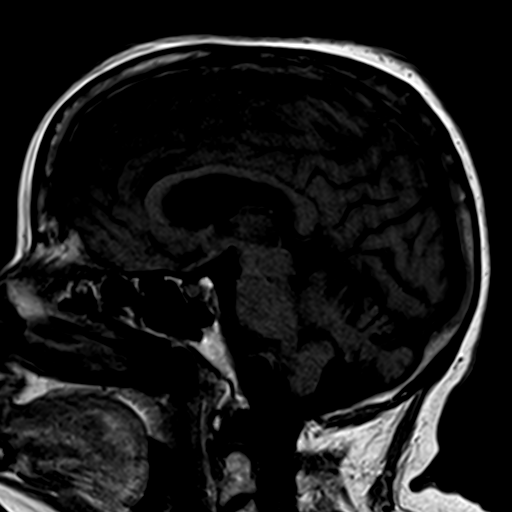
[im 20/20]
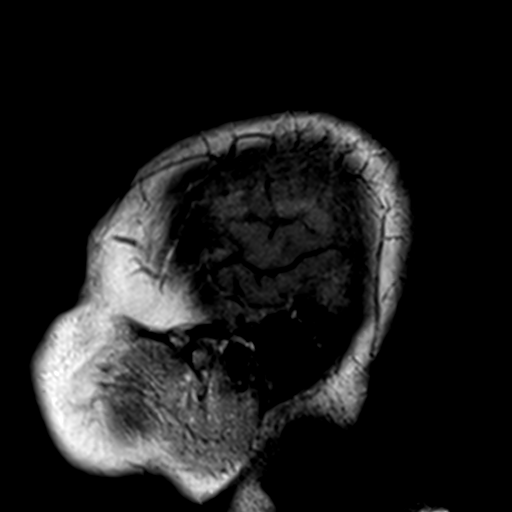

[Series 7: T2 · axial · 5.0mm · 0.44mm/px · z∈[-25,+115]mm · 3 of 23 slices shown (1 of 2)]
[im 1/23]
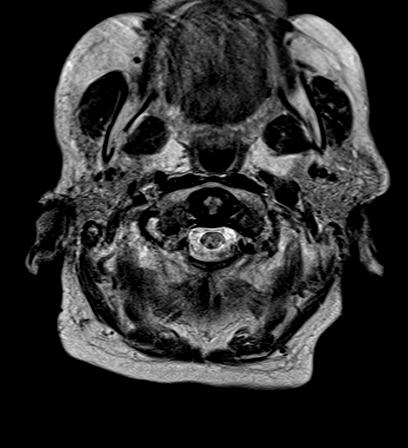
[im 12/23]
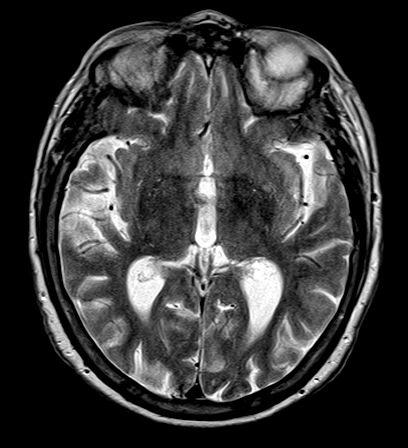
[im 23/23]
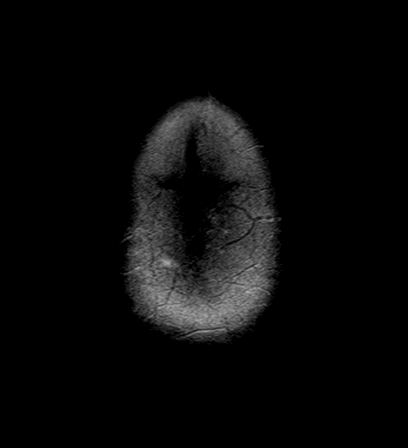

[Series 8: FLAIR · axial · 5.0mm · 0.31mm/px · z∈[-24,+115]mm · 3 of 23 slices shown]
[im 1/23]
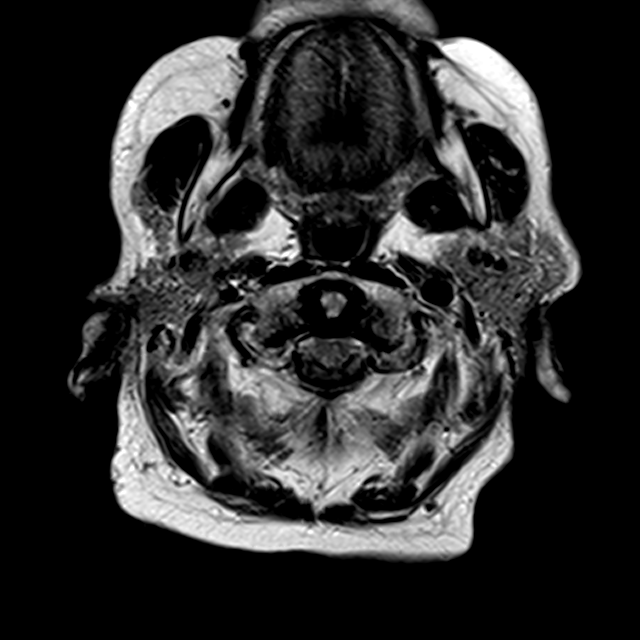
[im 12/23]
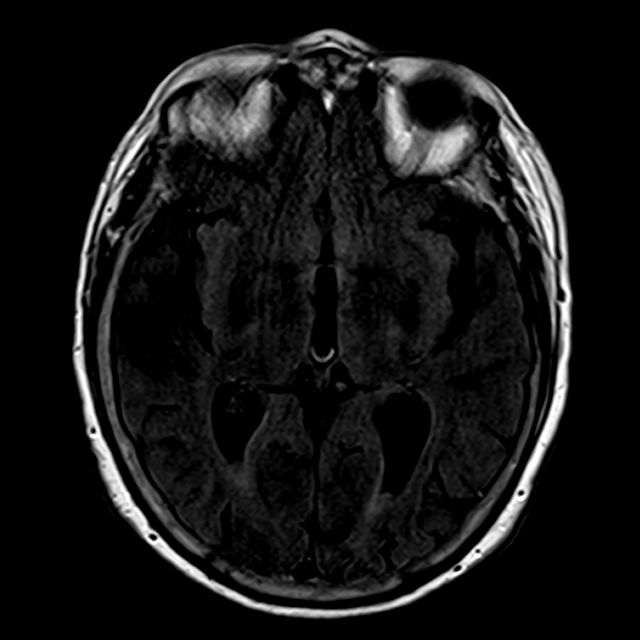
[im 23/23]
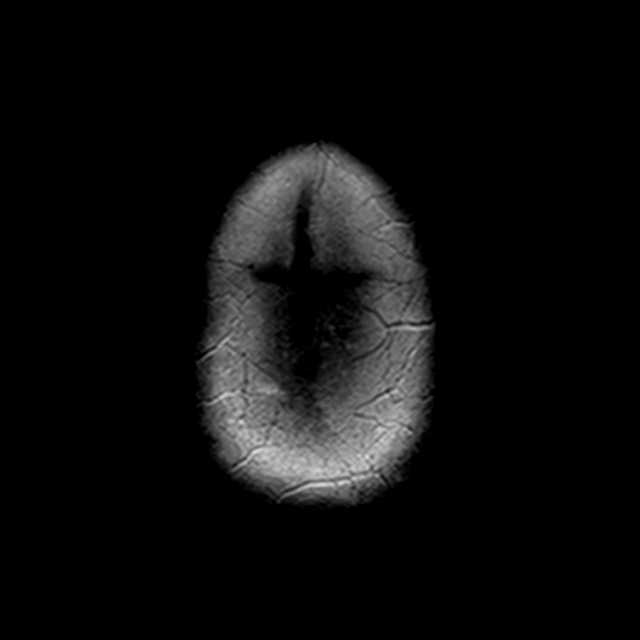

[Series 9: T1 · axial · 2.0mm · 0.40mm/px · z∈[-26,+119]mm · 8 of 75 slices shown (2 of 3)]
[im 1/75]
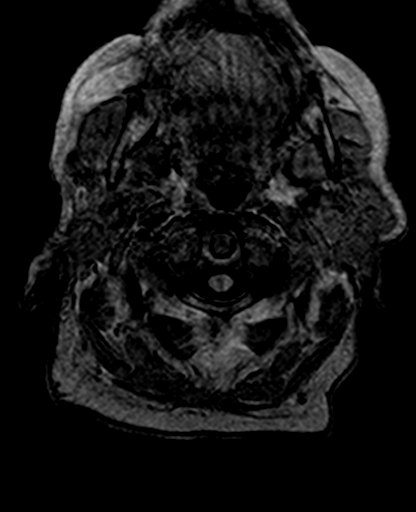
[im 15/75]
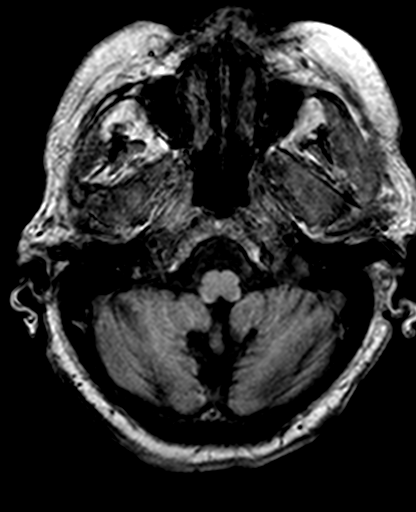
[im 23/75]
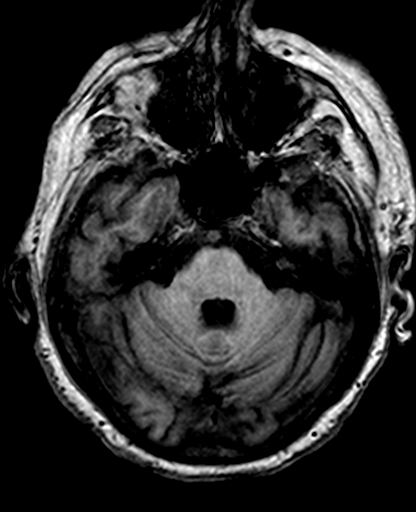
[im 30/75]
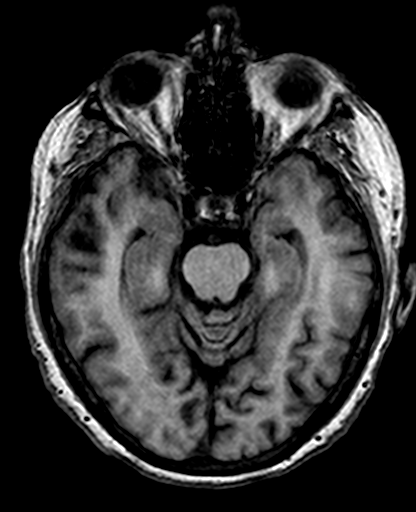
[im 45/75]
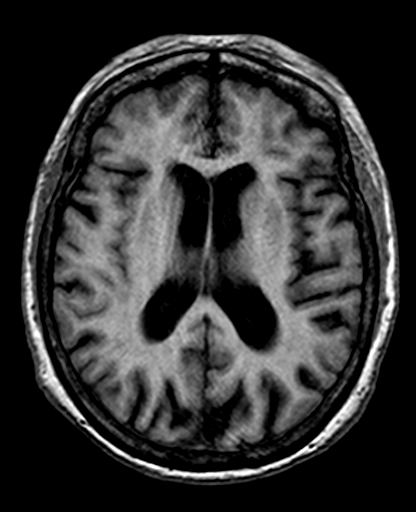
[im 52/75]
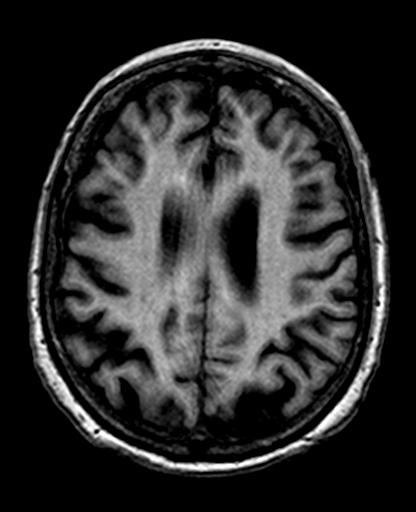
[im 60/75]
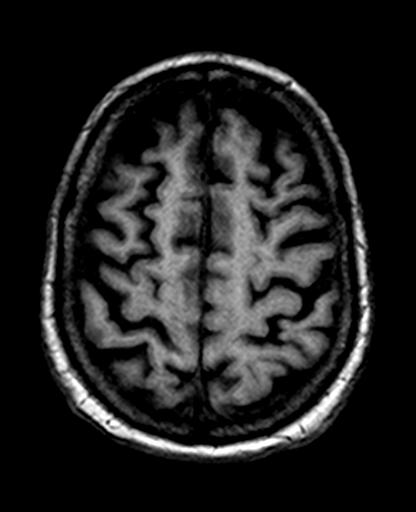
[im 75/75]
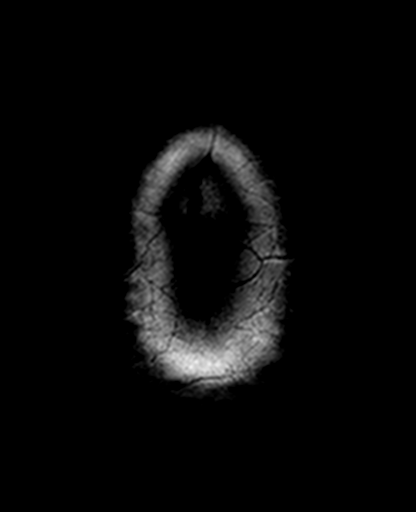

[Series 11: T2 · coronal · 5.0mm · 0.45mm/px · 4 of 26 slices shown (2 of 2)]
[im 1/26]
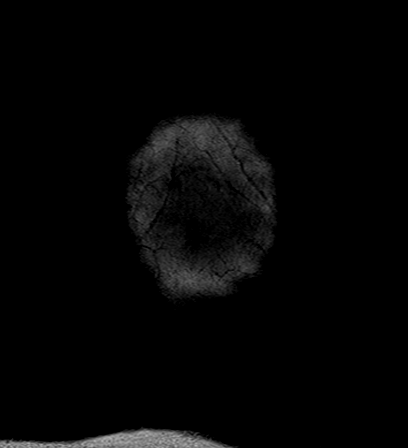
[im 9/26]
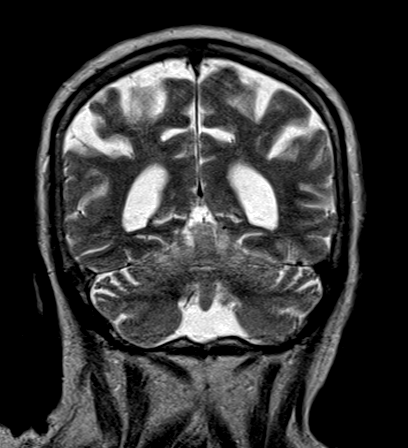
[im 17/26]
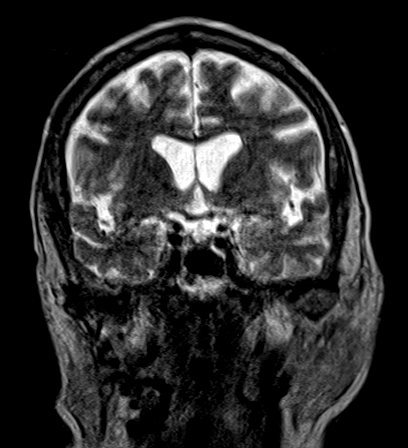
[im 26/26]
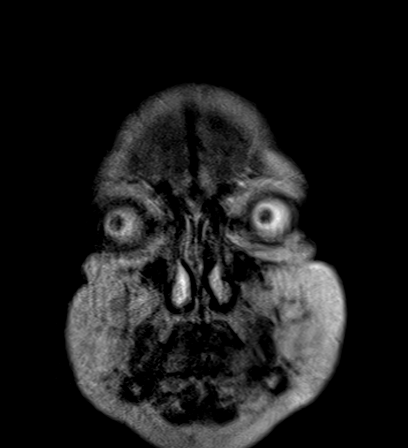

[Series 12: T1 · sagittal · 5.0mm · 0.79mm/px · 2 of 19 slices shown (3 of 3)]
[im 1/19]
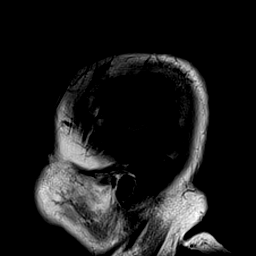
[im 10/19]
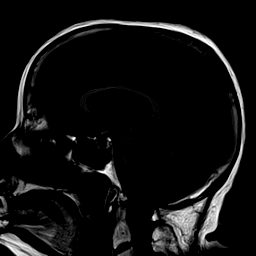

[23 of 48 positions shown; findings below may reference images not displayed]

FINDINGS: The diffusion-weighted images demonstrate progression of restricted
diffusion an acute nonhemorrhagic infarct within the posterior right
frontal lobe along the prior motor cortex and within the subjacent
white matter. A 5 mm linear area of acute infarction is noted within
the right occipital lobe, new from the prior exam. Additional more
anterior right frontal lobe infarcts are no longer visible on the
diffusion sequence.

T2 changes are associated with the progressive right MCA territory
infarcts. Periventricular and brainstem white matter changes are
stable.

No hemorrhage or mass lesion is present. The ventricles are of
normal size. No significant extraaxial fluid collection is present.

Flow is present in the major intracranial arteries. The globes and
orbits are intact. Paranasal sinuses and mastoid air cells are
clear. A relatively empty sella is stable. The craniocervical
junction is within normal limits.
IMPRESSION: 1. Progressive right MCA territory infarcts involving the posterior
right frontal lobe and anterior right parietal lobe without
hemorrhage.
2. New 5 mm acute nonhemorrhagic infarct within the right occipital
lobe.
3. Minimal periventricular white matter changes are otherwise
stable.

## 2016-01-11 IMAGING — US US CAROTID DUPLEX BILAT
1 series · 13 of 24 positions shown · non-contrast
Comparison: 07/27/2014

CLINICAL DATA: Small acute stroke, syncope, visual disturbance,
hyperlipidemia

EXAM:
BILATERAL CAROTID DUPLEX ULTRASOUND
TECHNIQUE: Gray scale imaging, color Doppler and duplex ultrasound were
performed of bilateral carotid and vertebral arteries in the neck.

[Series 1: us carotid duplex bilat · 0.06mm/px · 13 of 68 slices shown]
[im 1/68]
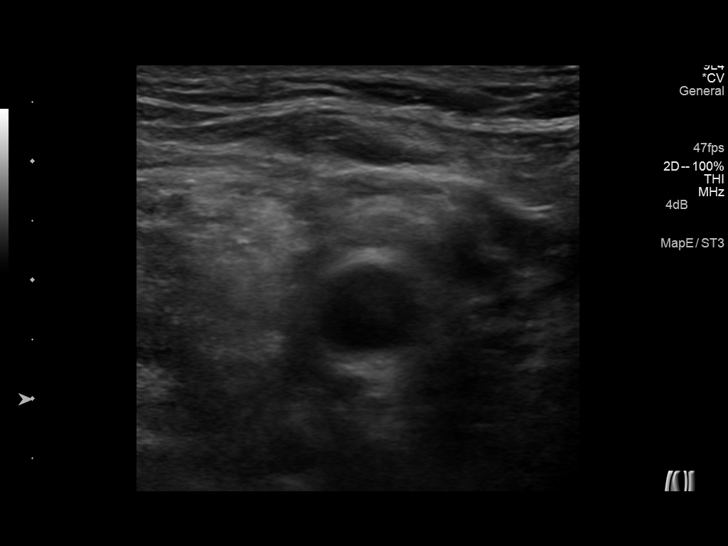
[im 6/68]
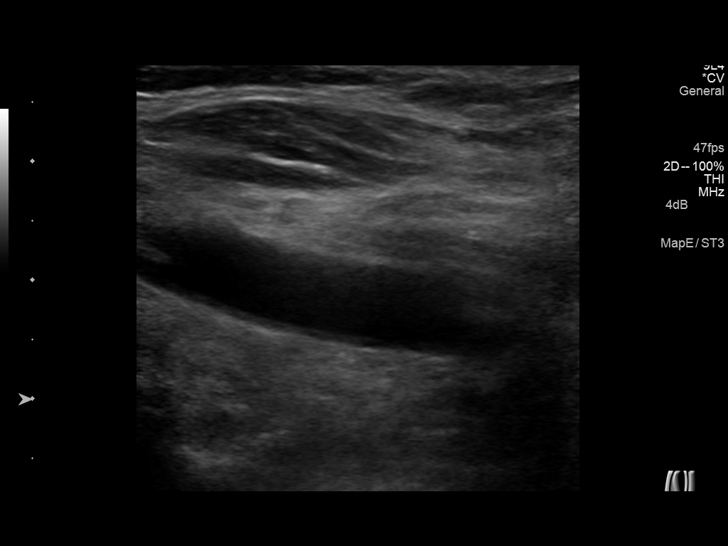
[im 12/68]
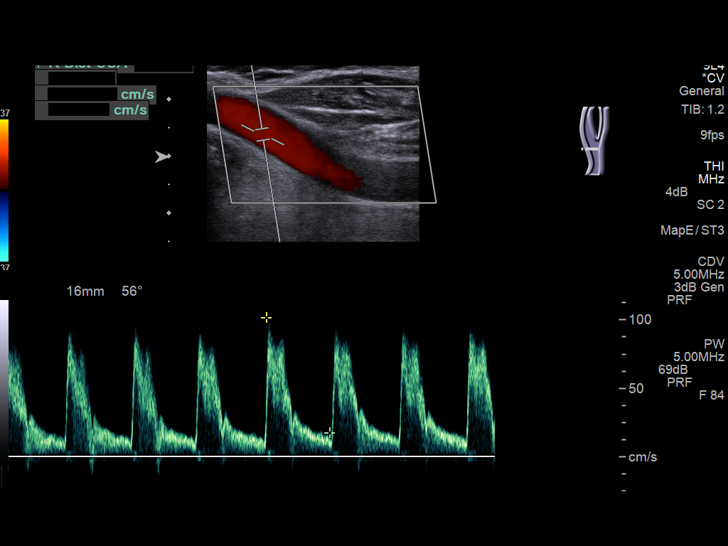
[im 18/68]
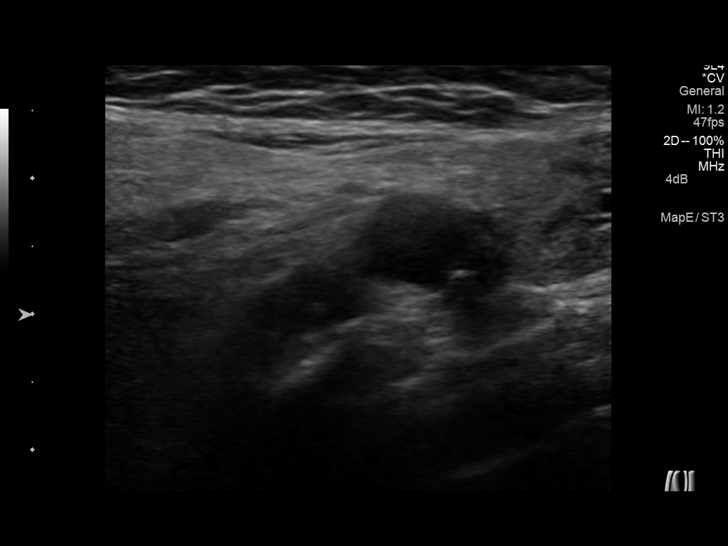
[im 24/68]
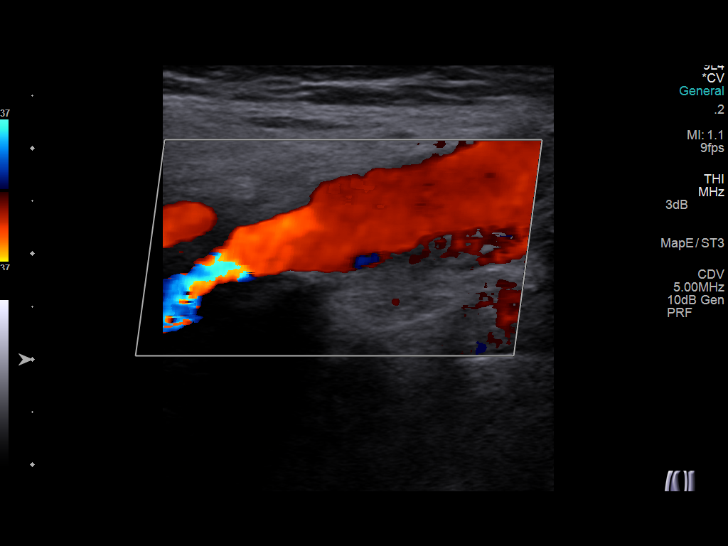
[im 30/68]
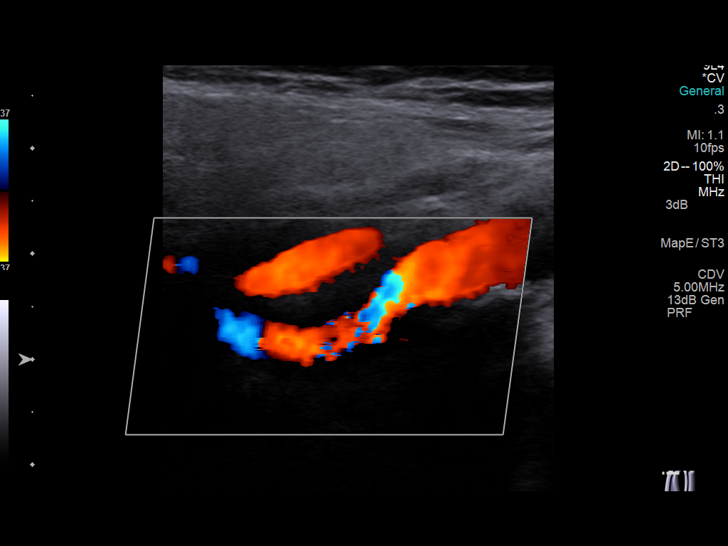
[im 35/68]
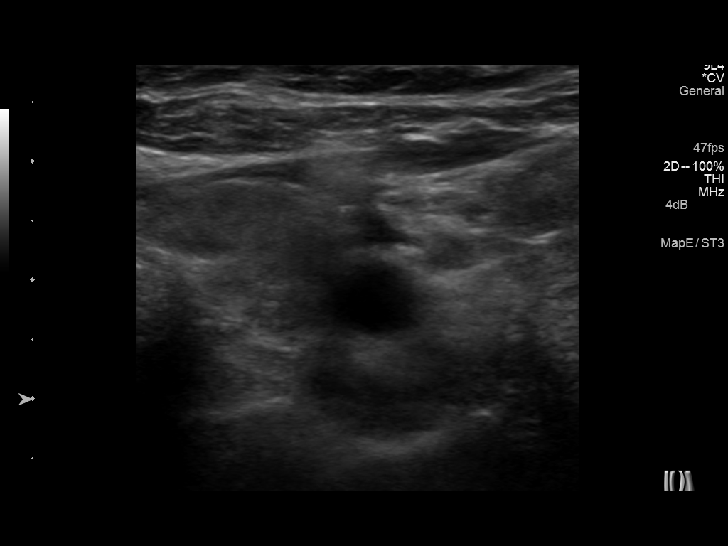
[im 38/68]
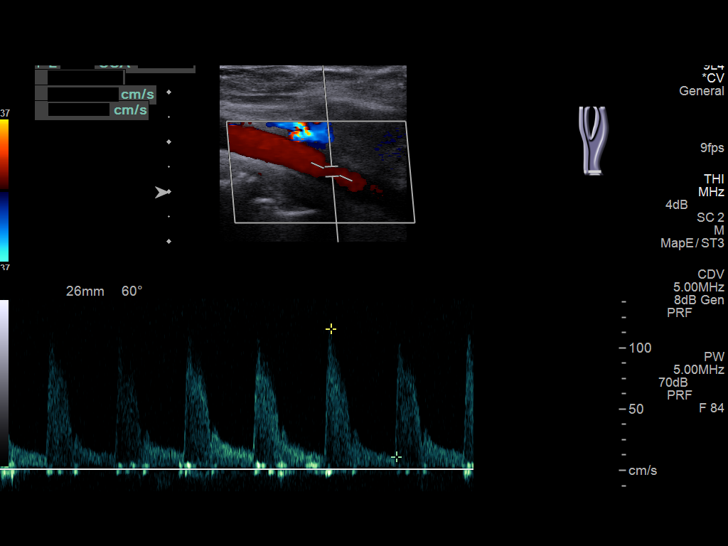
[im 44/68]
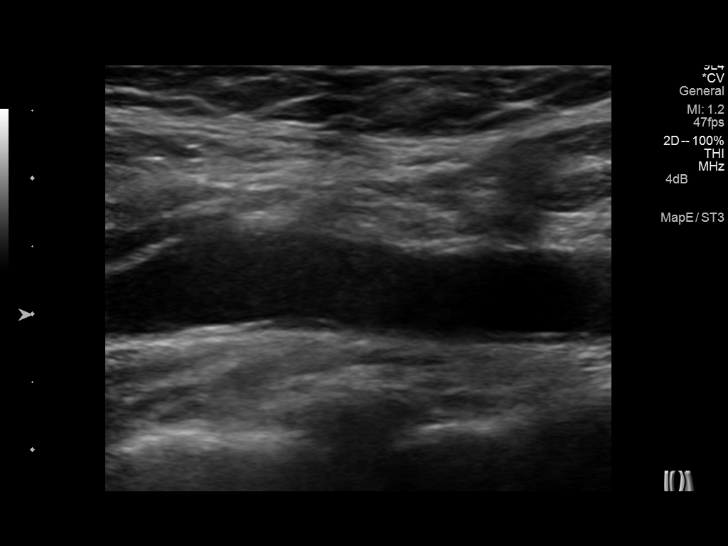
[im 50/68]
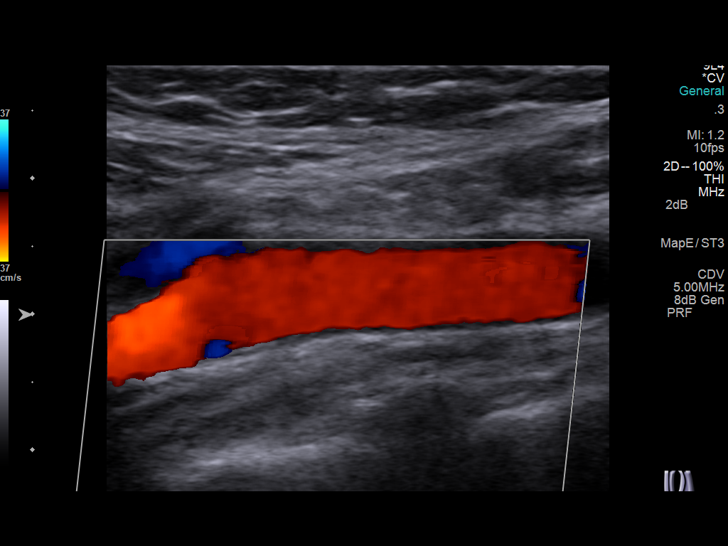
[im 56/68]
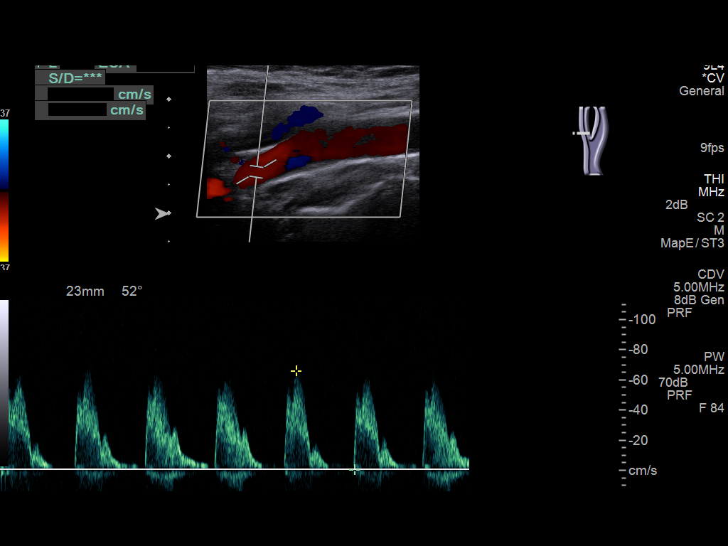
[im 62/68]
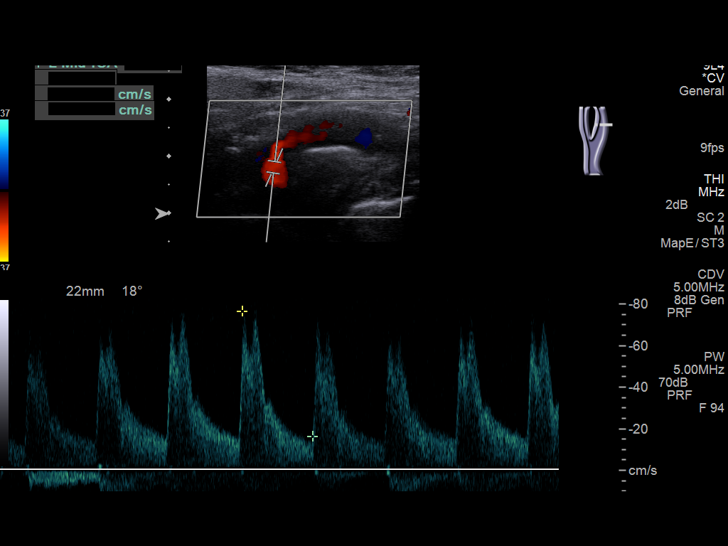
[im 68/68]
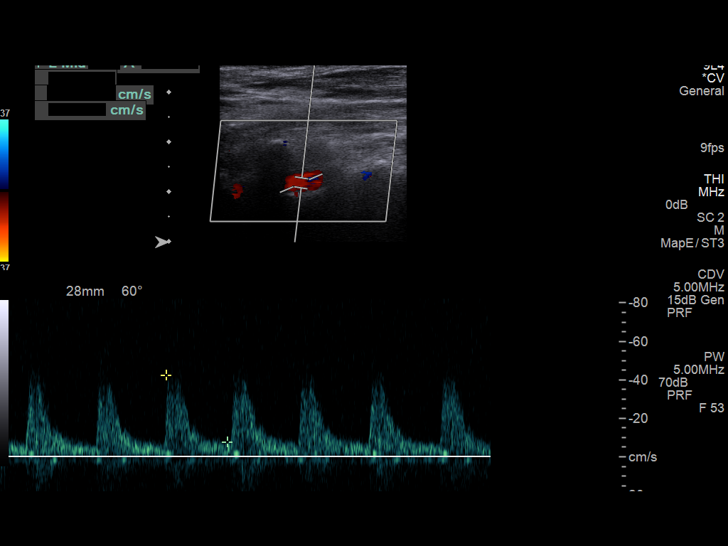

[13 of 24 positions shown; findings below may reference images not displayed]

FINDINGS: Criteria: Quantification of carotid stenosis is based on velocity
parameters that correlate the residual internal carotid diameter
with NASCET-based stenosis levels, using the diameter of the distal
internal carotid lumen as the denominator for stenosis measurement.

The following velocity measurements were obtained:

RIGHT

ICA:  115/23 cm/sec

CCA:  85/13 cm/sec

SYSTOLIC ICA/CCA RATIO:

DIASTOLIC ICA/CCA RATIO:

ECA:  112 cm/sec

LEFT

ICA:  77/16 cm/sec

CCA:  85/9 cm/sec

SYSTOLIC ICA/CCA RATIO:

DIASTOLIC ICA/CCA RATIO:

ECA:  66 cm/sec

RIGHT CAROTID ARTERY: Minor echogenic shadowing plaque formation. No
hemodynamically significant right ICA stenosis, velocity elevation,
or turbulent flow. Degree of narrowing less than 50%.

RIGHT VERTEBRAL ARTERY:  Antegrade

LEFT CAROTID ARTERY: Similar scattered minor echogenic plaque
formation. No hemodynamically significant left ICA stenosis,
velocity elevation, or turbulent flow.

LEFT VERTEBRAL ARTERY:  Antegrade
IMPRESSION: Minor carotid atherosclerosis. No hemodynamically significant ICA
stenosis by ultrasound. Degree of narrowing less than 50%
bilaterally

## 2016-01-13 IMAGING — US US EXTREM LOW VENOUS*L*
1 series · 13 of 24 positions shown · non-contrast
Comparison: None.

CLINICAL DATA: Left leg pain and swelling



[Series 1: us extrem low venous*left* · 0.09mm/px · 13 of 35 slices shown]
[im 1/35]
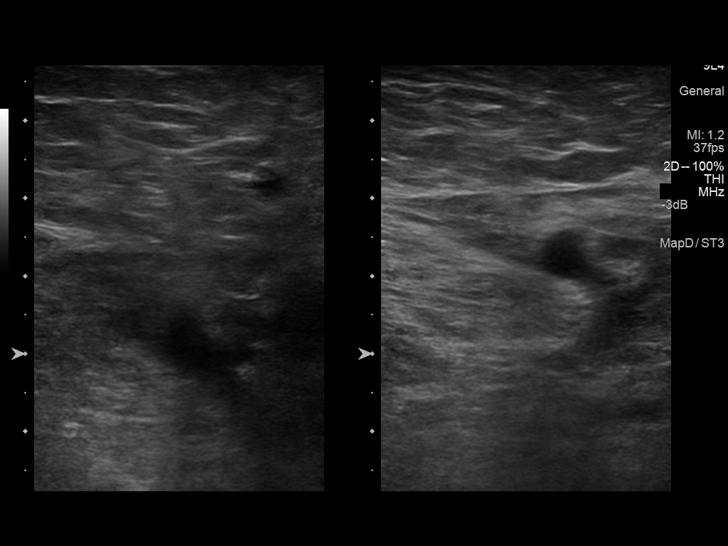
[im 3/35]
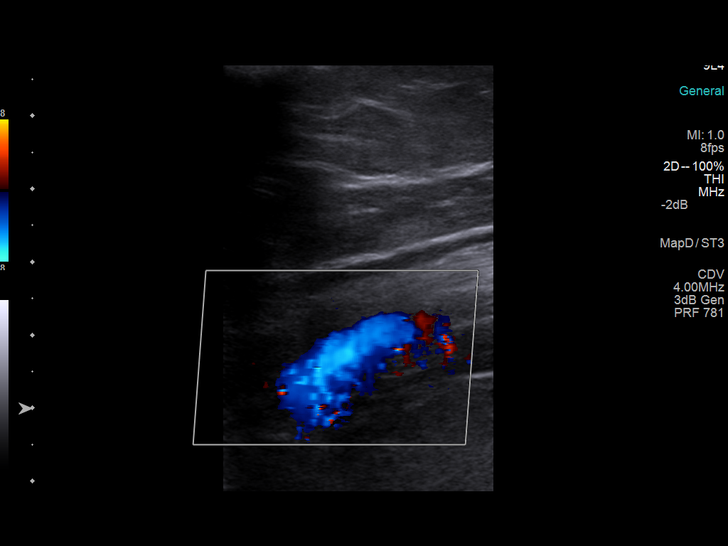
[im 6/35]
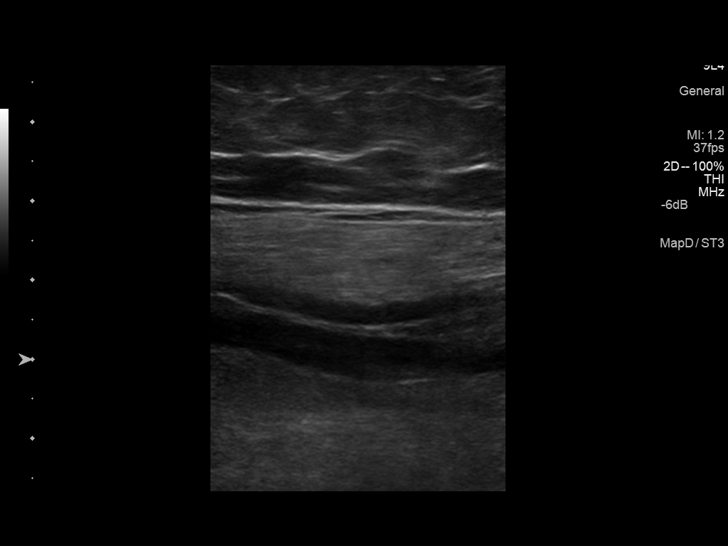
[im 9/35]
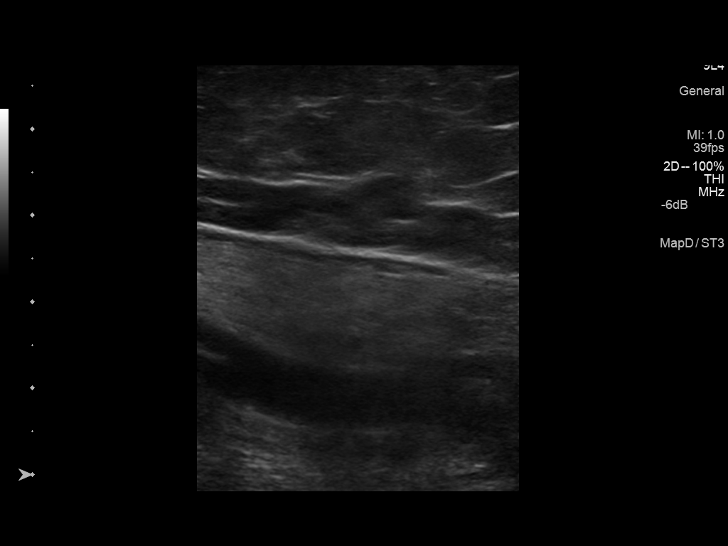
[im 12/35]
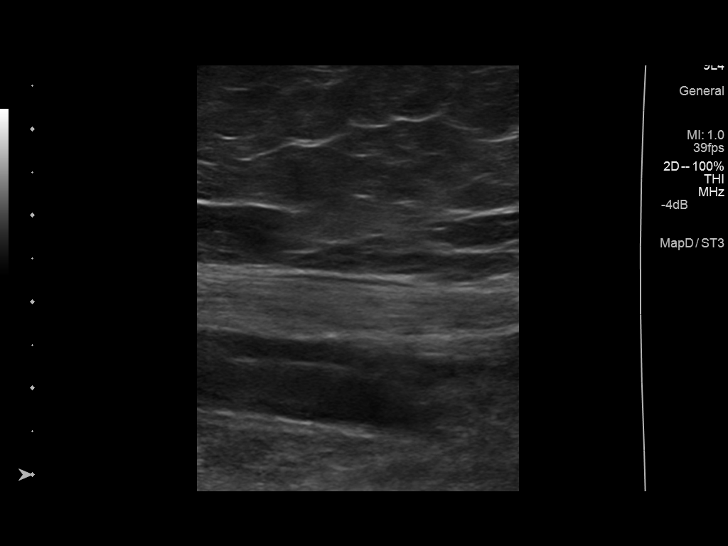
[im 15/35]
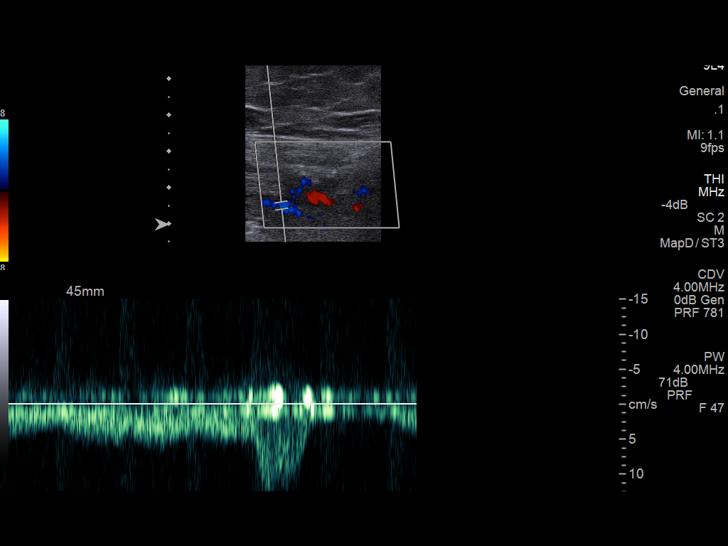
[im 18/35]
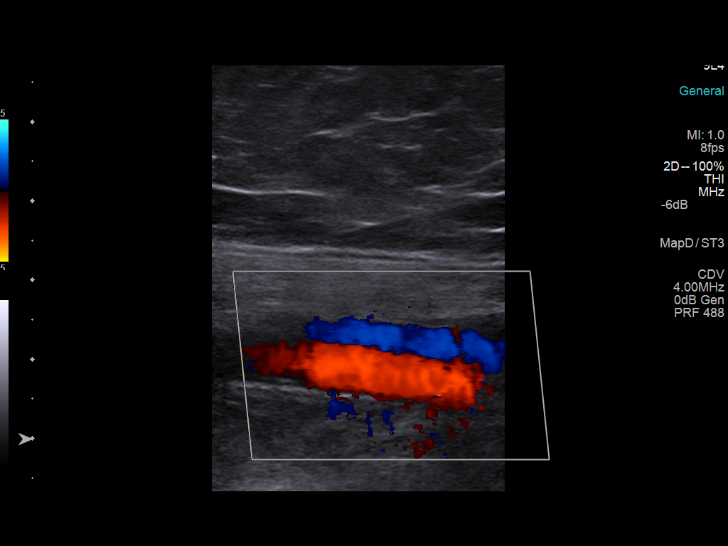
[im 20/35]
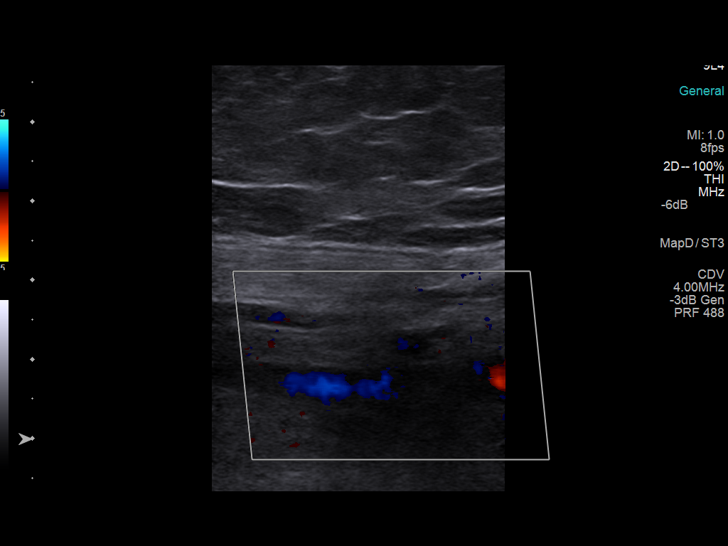
[im 23/35]
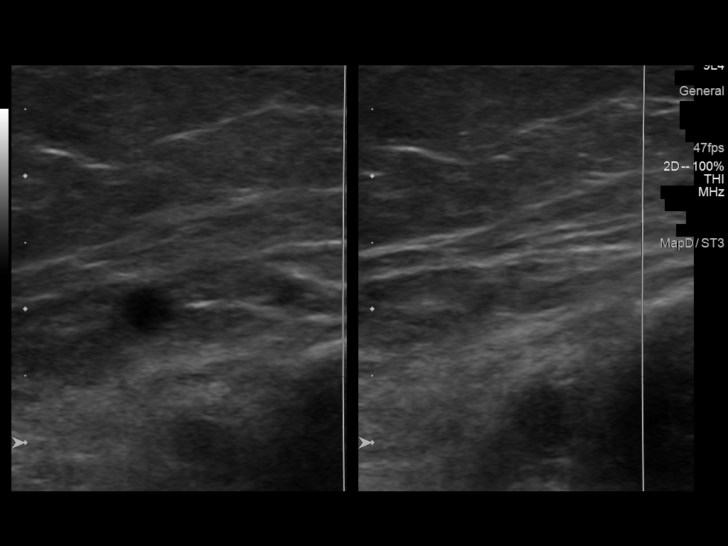
[im 26/35]
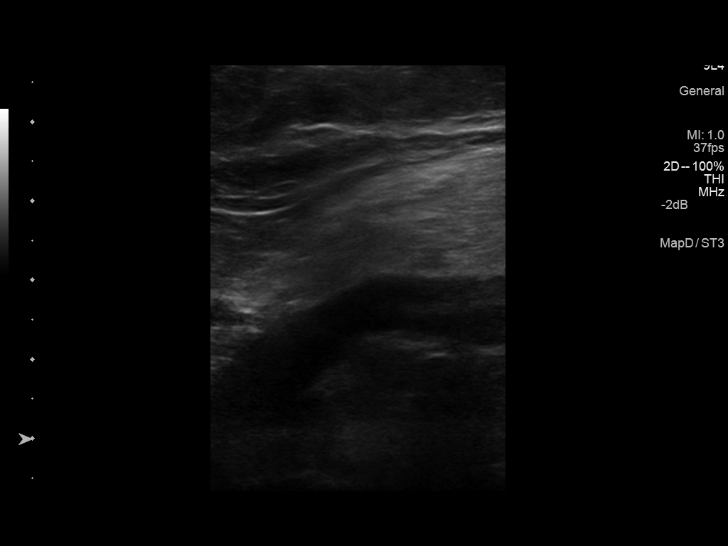
[im 29/35]
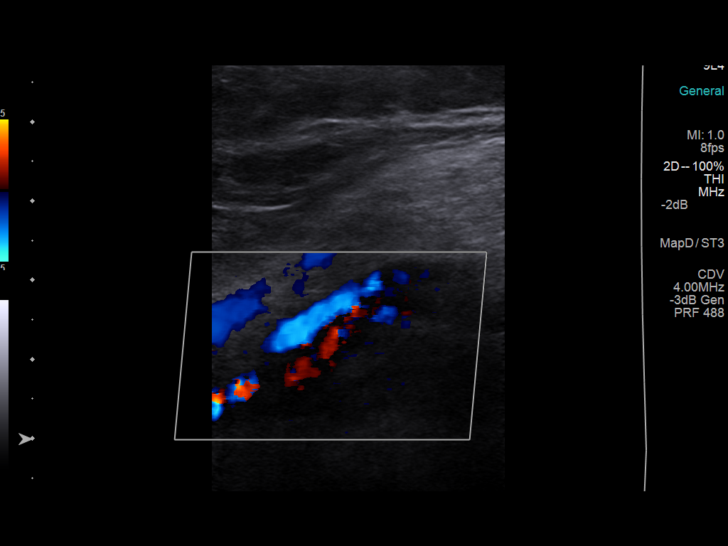
[im 32/35]
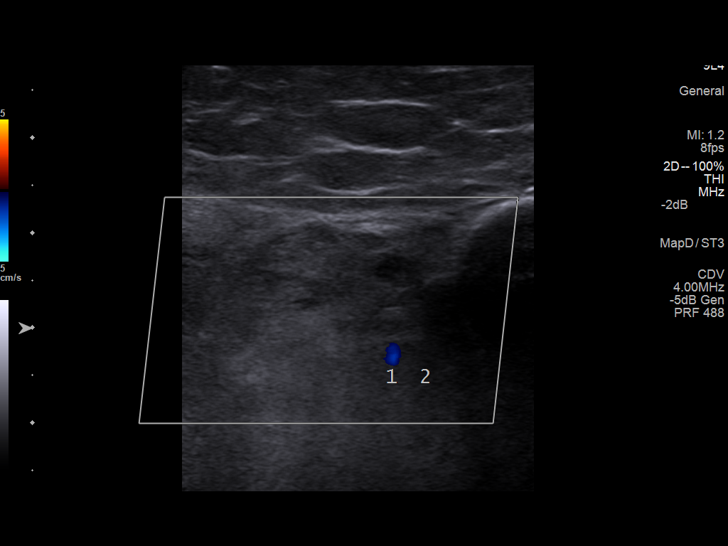
[im 35/35]
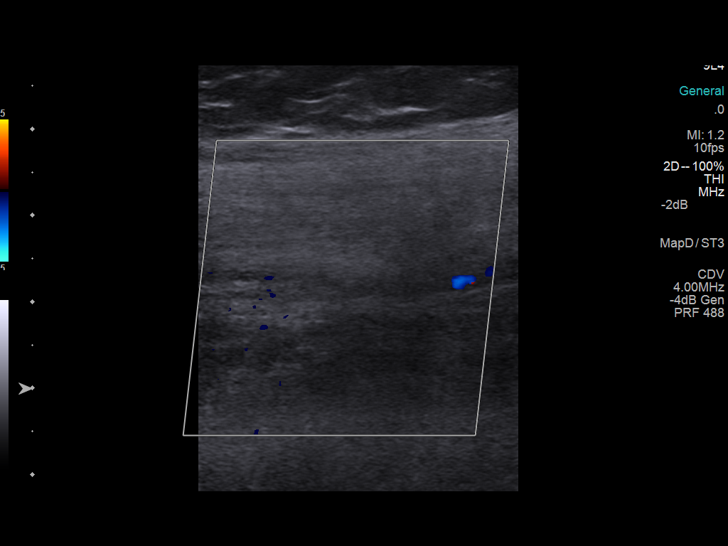

[13 of 24 positions shown; findings below may reference images not displayed]

FINDINGS: Common Femoral Vein: No evidence of thrombus. Normal
compressibility, respiratory phasicity and response to augmentation.

Saphenofemoral Junction: No evidence of thrombus. Normal
compressibility and flow on color Doppler imaging.

Profunda Femoral Vein: No evidence of thrombus. Normal
compressibility and flow on color Doppler imaging.

Femoral Vein: No evidence of thrombus. Normal compressibility,
respiratory phasicity and response to augmentation.

Popliteal Vein: No evidence of thrombus. Normal compressibility,
respiratory phasicity and response to augmentation.

Calf Veins: No evidence of thrombus. Normal compressibility and flow
on color Doppler imaging.

Superficial Great Saphenous Vein: No evidence of thrombus. Normal
compressibility and flow on color Doppler imaging.

Venous Reflux:  None.

Other Findings:  None.
IMPRESSION: No evidence of deep venous thrombosis.

## 2016-02-03 ENCOUNTER — Other Ambulatory Visit: Payer: Self-pay | Admitting: Cardiology

## 2021-05-18 DEATH — deceased
